# Patient Record
Sex: Male | Born: 1995 | Race: White | Hispanic: No | Marital: Single | Smoking: Never smoker
Health system: Southern US, Community
[De-identification: ages and names within clinical notes are randomized; demographics above are authoritative.]

## PROBLEM LIST (undated history)

## (undated) DIAGNOSIS — K259 Gastric ulcer, unspecified as acute or chronic, without hemorrhage or perforation: Secondary | ICD-10-CM

## (undated) HISTORY — PX: WRIST SURGERY: SHX841

---
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
## 2015-01-19 ENCOUNTER — Emergency Department (INDEPENDENT_AMBULATORY_CARE_PROVIDER_SITE_OTHER)
Admission: EM | Admit: 2015-01-19 | Discharge: 2015-01-19 | Disposition: A | Discharge status: Home / Self Care | Payer: Medicaid Other | Source: Home / Self Care | Attending: Family Medicine | Admitting: Family Medicine

## 2015-01-19 ENCOUNTER — Encounter (HOSPITAL_COMMUNITY): Payer: Self-pay | Admitting: Emergency Medicine

## 2015-01-19 DIAGNOSIS — K2100 Gastro-esophageal reflux disease with esophagitis, without bleeding: Secondary | ICD-10-CM

## 2015-01-19 DIAGNOSIS — K279 Peptic ulcer, site unspecified, unspecified as acute or chronic, without hemorrhage or perforation: Secondary | ICD-10-CM

## 2015-01-19 HISTORY — DX: Gastric ulcer, unspecified as acute or chronic, without hemorrhage or perforation: K25.9

## 2015-01-19 LAB — POCT H PYLORI SCREEN: H. PYLORI SCREEN, POC: NEGATIVE

## 2015-01-19 MED ORDER — GI COCKTAIL ~~LOC~~
30.0000 mL | Freq: Once | ORAL | Status: AC
  Administered 2015-01-19: 30 mL via ORAL

## 2015-01-19 MED ORDER — SUCRALFATE 1 G PO TABS: 1.0000 g | ORAL_TABLET | Freq: Three times a day (TID) | ORAL | Status: DC

## 2015-01-19 MED ORDER — GI COCKTAIL ~~LOC~~
ORAL | Status: AC
  Filled 2015-01-19: qty 30

## 2015-01-19 MED ORDER — ONDANSETRON 4 MG PO TBDP
ORAL_TABLET | ORAL | Status: AC
  Filled 2015-01-19: qty 2

## 2015-01-19 MED ORDER — OMEPRAZOLE 40 MG PO CPDR: 40.0000 mg | DELAYED_RELEASE_CAPSULE | Freq: Two times a day (BID) | ORAL | Status: DC

## 2015-01-19 MED ORDER — ONDANSETRON HCL 4 MG PO TABS: 4.0000 mg | ORAL_TABLET | Freq: Three times a day (TID) | ORAL | Status: DC | PRN

## 2015-01-19 MED ORDER — ONDANSETRON 4 MG PO TBDP
8.0000 mg | ORAL_TABLET | Freq: Once | ORAL | Status: AC
  Administered 2015-01-19: 8 mg via ORAL

## 2015-01-19 NOTE — Discharge Instructions (Signed)
He likely have too much acid in her stomach causing her pain. There is no evidence of infection today. Your given medicine to help reverse the effects of your condition. Please take the Prilosec, Carafate every day as prescribed. Please use the Zofran for additional pain relief. Please call the GI doctor again to try and get an appointment there. Please go to the emergency room if you get worse.

## 2015-01-19 NOTE — ED Notes (Signed)
Pt has almost a one year history for Gastric Ulcers.  Pt was to have an EGD but was unable to have that done.  Pt started back with the abdominal pain but this time it is with vomiting.  Pt is unable to keep anything down for the past 5 hours.

## 2015-01-19 NOTE — ED Provider Notes (Signed)
CSN: [id number]     Arrival date & time 01/19/15  1355 History   First MD Initiated Contact with Patient 01/19/15 1501     Chief Complaint  Patient presents with  . Emesis  . Abdominal Pain   (Consider location/radiation/quality/duration/timing/severity/associated sxs/prior Treatment) HPI Double pain. Epigastric with radiation up his esophagus to lower neck area. Burning. Associated with nausea, ill feeling. Constant. Getting worse. Patient states that he has episodes this from time to time his had a subjective diagnoses of gastric ulcers/PUD. No actual EGD done in the past. Patient was post to follow-up with GI after his last ED visit was unable to get prior authorization needed. Patient states that his symptoms were under control after taking Prilosec and Carafate for a period of time. However he has run out of these medicines. He is not taken it for symptoms today. Denies any fevers, chest pain, short of breath, palpitations This, headache. Intermittent diarrhea. Patient states that he never uses NSAIDs.  Past Medical History  Diagnosis Date  . Multiple gastric ulcers    Past Surgical History  Procedure Laterality Date  . Orthopedic surgery     Family History  Problem Relation Age of Onset  . Heart disease Other    History  Substance Use Topics  . Smoking status: Never Smoker   . Smokeless tobacco: Never Used  . Alcohol Use: No    Review of Systems Per HPI with all other pertinent systems negative.    Allergies  Review of patient's allergies indicates no known allergies.  Home Medications   Prior to Admission medications   Medication Sig Start Date End Date Taking? Authorizing Provider  ibuprofen (ADVIL,MOTRIN) 200 MG tablet Take 200 mg by mouth every 6 (six) hours as needed. For pain    Historical Provider, MD  omeprazole (PRILOSEC) 40 MG capsule Take 1 capsule (40 mg total) by mouth 2 (two) times daily before a meal. 01/19/15   Ozella Rocks, MD  ondansetron (ZOFRAN) 4  MG tablet Take 1 tablet (4 mg total) by mouth every 8 (eight) hours as needed for nausea or vomiting. 01/19/15   Ozella Rocks, MD  sucralfate (CARAFATE) 1 G tablet Take 1 tablet (1 g total) by mouth 4 (four) times daily -  with meals and at bedtime. 01/19/15   Ozella Rocks, MD   Temp(Src) 98.5 F (36.9 C) (Oral)  SpO2 100% Physical Exam Physical Exam  Constitutional: oriented to person, place, and time. appears well-developed and well-nourished. No distress.  HENT:  Head: Normocephalic and atraumatic.  Eyes: EOMI. PERRL.  Neck: Normal range of motion.  Cardiovascular: RRR, no m/r/g, 2+ distal pulses,  Pulmonary/Chest: Effort normal and breath sounds normal. No respiratory distress.  Abdominal: soft, nondistended. Epigastric ttp  Musculoskeletal: Normal range of motion. Non ttp, no effusion.  Neurological: alert and oriented to person, place, and time.  Skin: Skin is warm. No rash noted. non diaphoretic.  Psychiatric: normal mood and affect. behavior is normal. Judgment and thought content normal.   ED Course  Procedures (including critical care time) Labs Review Labs Reviewed - No data to display  Imaging Review No results found.   MDM   1. Reflux esophagitis   2. PUD (peptic ulcer disease)    H. pylori negative. Zofran 8 mg and GI cocktail given here in clinic. Patient to start daily Prilosec and Carafate. Zofran as needed for additional relief. Patient strongly counseled follow-up with GI as previous instructed for EGD.    Onalee Hua  Mauri Reading, MD 01/19/15 [phone]

## 2015-01-22 ENCOUNTER — Encounter (HOSPITAL_COMMUNITY): Payer: Self-pay | Admitting: Emergency Medicine

## 2015-01-22 ENCOUNTER — Emergency Department (HOSPITAL_COMMUNITY)
Admission: EM | Admit: 2015-01-22 | Discharge: 2015-01-22 | Disposition: A | Discharge status: Home / Self Care | Payer: Medicaid Other | Attending: Emergency Medicine | Admitting: Emergency Medicine

## 2015-01-22 DIAGNOSIS — R1115 Cyclical vomiting syndrome unrelated to migraine: Secondary | ICD-10-CM

## 2015-01-22 DIAGNOSIS — K259 Gastric ulcer, unspecified as acute or chronic, without hemorrhage or perforation: Secondary | ICD-10-CM | POA: Insufficient documentation

## 2015-01-22 DIAGNOSIS — R1110 Vomiting, unspecified: Secondary | ICD-10-CM | POA: Diagnosis present

## 2015-01-22 DIAGNOSIS — G43A0 Cyclical vomiting, not intractable: Secondary | ICD-10-CM | POA: Insufficient documentation

## 2015-01-22 DIAGNOSIS — Z79899 Other long term (current) drug therapy: Secondary | ICD-10-CM | POA: Insufficient documentation

## 2015-01-22 LAB — URINALYSIS, ROUTINE W REFLEX MICROSCOPIC
GLUCOSE, UA: NEGATIVE mg/dL
HGB URINE DIPSTICK: NEGATIVE
Ketones, ur: >80 mg/dL — AB
Leukocytes, UA: NEGATIVE
Nitrite: NEGATIVE
PROTEIN: 30 mg/dL — AB
Specific Gravity, Urine: 1.036 — ABNORMAL HIGH (ref 1.005–1.030)
Urobilinogen, UA: 1 mg/dL (ref 0.0–1.0)
pH: 6 (ref 5.0–8.0)

## 2015-01-22 LAB — COMPREHENSIVE METABOLIC PANEL
ALK PHOS: 61 U/L (ref 38–126)
ALT: 17 U/L (ref 17–63)
AST: 26 U/L (ref 15–41)
Albumin: 5.5 g/dL — ABNORMAL HIGH (ref 3.5–5.0)
Anion gap: 15 (ref 5–15)
BUN: 25 mg/dL — ABNORMAL HIGH (ref 6–20)
CALCIUM: 10.5 mg/dL — AB (ref 8.9–10.3)
CO2: 23 mmol/L (ref 22–32)
Chloride: 99 mmol/L — ABNORMAL LOW (ref 101–111)
Creatinine, Ser: 1.18 mg/dL (ref 0.61–1.24)
GLUCOSE: 111 mg/dL — AB (ref 65–99)
Potassium: 3.1 mmol/L — ABNORMAL LOW (ref 3.5–5.1)
Sodium: 137 mmol/L (ref 135–145)
TOTAL PROTEIN: 8.6 g/dL — AB (ref 6.5–8.1)
Total Bilirubin: 1.8 mg/dL — ABNORMAL HIGH (ref 0.3–1.2)

## 2015-01-22 LAB — CBC WITH DIFFERENTIAL/PLATELET
Basophils Absolute: 0 10*3/uL (ref 0.0–0.1)
Basophils Relative: 0 % (ref 0–1)
EOS PCT: 0 % (ref 0–5)
Eosinophils Absolute: 0 10*3/uL (ref 0.0–0.7)
HEMATOCRIT: 45.9 % (ref 39.0–52.0)
HEMOGLOBIN: 16.4 g/dL (ref 13.0–17.0)
LYMPHS ABS: 1.9 10*3/uL (ref 0.7–4.0)
Lymphocytes Relative: 18 % (ref 12–46)
MCH: 29.2 pg (ref 26.0–34.0)
MCHC: 35.7 g/dL (ref 30.0–36.0)
MCV: 81.7 fL (ref 78.0–100.0)
MONOS PCT: 9 % (ref 3–12)
Monocytes Absolute: 1 10*3/uL (ref 0.1–1.0)
Neutro Abs: 7.7 10*3/uL (ref 1.7–7.7)
Neutrophils Relative %: 73 % (ref 43–77)
Platelets: 227 10*3/uL (ref 150–400)
RBC: 5.62 MIL/uL (ref 4.22–5.81)
RDW: 13 % (ref 11.5–15.5)
WBC: 10.6 10*3/uL — ABNORMAL HIGH (ref 4.0–10.5)

## 2015-01-22 LAB — I-STAT CG4 LACTIC ACID, ED
LACTIC ACID, VENOUS: 2.15 mmol/L — AB (ref 0.5–2.0)
Lactic Acid, Venous: 0.95 mmol/L (ref 0.5–2.0)

## 2015-01-22 LAB — URINE MICROSCOPIC-ADD ON

## 2015-01-22 LAB — LIPASE, BLOOD: Lipase: 20 U/L — ABNORMAL LOW (ref 22–51)

## 2015-01-22 MED ORDER — PROMETHAZINE HCL 25 MG PO TABS: 25.0000 mg | ORAL_TABLET | Freq: Four times a day (QID) | ORAL | Status: DC | PRN

## 2015-01-22 MED ORDER — POTASSIUM CHLORIDE CRYS ER 20 MEQ PO TBCR
40.0000 meq | EXTENDED_RELEASE_TABLET | Freq: Once | ORAL | Status: AC
  Administered 2015-01-22: 40 meq via ORAL
  Filled 2015-01-22: qty 2

## 2015-01-22 MED ORDER — PROMETHAZINE HCL 25 MG/ML IJ SOLN
12.5000 mg | Freq: Once | INTRAMUSCULAR | Status: AC
  Administered 2015-01-22: 12.5 mg via INTRAVENOUS
  Filled 2015-01-22: qty 1

## 2015-01-22 MED ORDER — SODIUM CHLORIDE 0.9 % IV BOLUS (SEPSIS)
1000.0000 mL | Freq: Once | INTRAVENOUS | Status: AC
  Administered 2015-01-22: 1000 mL via INTRAVENOUS

## 2015-01-22 MED ORDER — SODIUM CHLORIDE 0.9 % IV BOLUS (SEPSIS)
1000.0000 mL | Freq: Once | INTRAVENOUS | Status: AC
Start: 2015-01-22 — End: 2015-01-22
  Administered 2015-01-22: 1000 mL via INTRAVENOUS

## 2015-01-22 NOTE — ED Notes (Signed)
MD at bedside. 

## 2015-01-22 NOTE — ED Notes (Signed)
Pt reports emesis x 3 days which is not controlled by zofran which was prescribed 3 days ago. Pt alert x4. NAD at this time.

## 2015-01-22 NOTE — ED Provider Notes (Signed)
CSN: [id number]     Arrival date & time 01/22/15  0910 History   First MD Initiated Contact with Patient 01/22/15 0932     Chief Complaint  Patient presents with  . Emesis     (Consider location/radiation/quality/duration/timing/severity/associated sxs/prior Treatment) HPI Comments: 19 year old otherwise healthy male who presents with epigastric pain and vomiting. The patient was evaluated at urgent care 3 days ago for these symptoms. His workup was unremarkable at that time and he was discharged on Zofran, omeprazole, and Carafate for suspicion of PUD. He had a similar episode of epigastric pain several years ago for which he was supposed to follow up with a gastroenterologist for upper GI but was unable to follow-up due to need for referral from PCP. He states that since being home, he has continued to have multiple episodes of nonbloody emesis and continued intermittent epigastric pain that does not know his into his lower abdomen. He has been constipated and denies any episodes of diarrhea or bloody stools. No fevers but he has been sweaty and chilled at night. He was up all night vomiting and states that the Zofran has not helped. He has been unable to tolerate the other by mouth medications. Patient does endorse marijuana use more than once weekly. He states that the marijuana seems to help with the nausea. However, he has not used in the past few days since he's been vomiting so much.  Patient is a 19 y.o. male presenting with vomiting. The history is provided by the patient.  Emesis   Past Medical History  Diagnosis Date  . Multiple gastric ulcers    Past Surgical History  Procedure Laterality Date  . Orthopedic surgery     Family History  Problem Relation Age of Onset  . Heart disease Other    Social History  Substance Use Topics  . Smoking status: Never Smoker   . Smokeless tobacco: Never Used  . Alcohol Use: No    Review of Systems  Gastrointestinal: Positive for  vomiting.    10 Systems reviewed and are negative for acute change except as noted in the HPI.   Allergies  Review of patient's allergies indicates no known allergies.  Home Medications   Prior to Admission medications   Medication Sig Start Date End Date Taking? Authorizing Provider  cetirizine (ZYRTEC) 10 MG tablet Take 10 mg by mouth once.   Yes Historical Provider, MD  omeprazole (PRILOSEC) 40 MG capsule Take 1 capsule (40 mg total) by mouth 2 (two) times daily before a meal. 01/19/15  Yes Ozella Rocks, MD  ondansetron (ZOFRAN) 4 MG tablet Take 1 tablet (4 mg total) by mouth every 8 (eight) hours as needed for nausea or vomiting. 01/19/15  Yes Ozella Rocks, MD  sucralfate (CARAFATE) 1 G tablet Take 1 tablet (1 g total) by mouth 4 (four) times daily -  with meals and at bedtime. 01/19/15  Yes Ozella Rocks, MD  promethazine (PHENERGAN) 25 MG tablet Take 1 tablet (25 mg total) by mouth every 6 (six) hours as needed for nausea or vomiting. 01/22/15   Laurence Spates, MD   BP 123/63 mmHg  Pulse 69  Temp(Src) 99.2 F (37.3 C) (Oral)  Resp 17  SpO2 99% Physical Exam  Constitutional: He is oriented to person, place, and time. He appears well-developed and well-nourished. No distress.  HENT:  Head: Normocephalic and atraumatic.  dry mucous membranes  Eyes: Conjunctivae are normal. Pupils are equal, round, and reactive to light.  Neck: Neck supple.  Cardiovascular: Normal rate, regular rhythm and normal heart sounds.   No murmur heard. Pulmonary/Chest: Effort normal and breath sounds normal.  Abdominal: Soft. Bowel sounds are normal. He exhibits no distension.  Mild mid epigastric tenderness w/ no rebound or guarding  Musculoskeletal: He exhibits no edema.  Neurological: He is alert and oriented to person, place, and time.  Fluent speech  Skin: Skin is warm and dry.  Psychiatric: He has a normal mood and affect. Judgment normal.  Nursing note and vitals reviewed.   ED  Course  Procedures (including critical care time) Labs Review Labs Reviewed  COMPREHENSIVE METABOLIC PANEL - Abnormal; Notable for the following:    Potassium 3.1 (*)    Chloride 99 (*)    Glucose, Bld 111 (*)    BUN 25 (*)    Calcium 10.5 (*)    Total Protein 8.6 (*)    Albumin 5.5 (*)    Total Bilirubin 1.8 (*)    All other components within normal limits  LIPASE, BLOOD - Abnormal; Notable for the following:    Lipase 20 (*)    All other components within normal limits  CBC WITH DIFFERENTIAL/PLATELET - Abnormal; Notable for the following:    WBC 10.6 (*)    All other components within normal limits  URINALYSIS, ROUTINE W REFLEX MICROSCOPIC (NOT AT [facility]) - Abnormal; Notable for the following:    Color, Urine AMBER (*)    Specific Gravity, Urine 1.036 (*)    Bilirubin Urine SMALL (*)    Ketones, ur >80 (*)    Protein, ur 30 (*)    All other components within normal limits  I-STAT CG4 LACTIC ACID, ED - Abnormal; Notable for the following:    Lactic Acid, Venous 2.15 (*)    All other components within normal limits  URINE MICROSCOPIC-ADD ON  I-STAT CG4 LACTIC ACID, ED    Imaging Review No results found.   EKG Interpretation None      MDM   Final diagnoses:  Non-intractable cyclical vomiting with nausea   19 year old male who presents with ongoing vomiting and midepigastric pain which has not improved after receiving Zofran, omeprazole, and Carafate from urgent care a few days ago. Patient well appearing with normal vital signs at presentation. He had mild midepigastric tenderness but no lower abdominal tenderness. He does endorse marijuana use and I suspect he may have a component of cyclical vomiting. Obtain labs listed above. Lactate 2.15 initially. Gave the patient on IV fluid bolus as well as Phenergan.  Labs notable for UA containing ketones and protein suggestive of dehydration, which is consistent with the patient's elevated lactate. Repeat lactate after 2 L IV  fluids is normal.  On repeat examination, the patient is well-appearing and states that his headache had improved. He has had no episodes of vomiting in the emergency department and has been able to drink water and eat crackers. 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 He has no lower abdominal pain to suggest acute abdominal process. The rest of his CMP is reassuring and he has been adequately rehydrated here. I provided him with Phenergan to use as needed at home and instructed him to discontinue all marijuana use pending follow-up with primary care provider for GI referral. Precautions reviewed and patient voiced understanding. Patient discharged in satisfactory condition.  Ambrose Finland  Aly Seidenberg, MD 01/22/15 1337

## 2015-01-22 NOTE — Discharge Instructions (Signed)
°Emergency Department Resource Guide °1) Find a Doctor and Pay Out of Pocket °Although you won't have to find out who is covered by your insurance plan, it is a good idea to ask around and get recommendations. You will then need to call the office and see if the doctor you have chosen will accept you as a new patient and what types of options they offer for patients who are self-pay. Some doctors offer discounts or will set up payment plans for their patients who do not have insurance, but you will need to ask so you aren't surprised when you get to your appointment. ° °2) Contact Your Local Health Department °Not all health departments have doctors that can see patients for sick visits, but many do, so it is worth a call to see if yours does. If you don't know where your local health department is, you can check in your phone book. The CDC also has a tool to help you locate your state's health department, and many state websites also have listings of all of their local health departments. ° °3) Find a Walk-in Clinic °If your illness is not likely to be very severe or complicated, you may want to try a walk in clinic. These are popping up all over the country in pharmacies, drugstores, and shopping centers. They're usually staffed by nurse practitioners or physician assistants that have been trained to treat common illnesses and complaints. They're usually fairly quick and inexpensive. However, if you have serious medical issues or chronic medical problems, these are probably not your best option. ° °No Primary Care Doctor: °- Call [facility] at  832-8000 - they can help you locate a primary care doctor that  accepts your insurance, provides certain services, etc. °- Physician Referral Service- 1-800-533-3463 ° °Chronic Pain Problems: °Organization         Address  Phone   Notes  °[facility]  (336) 297-2271 Patients need to be referred by their primary care doctor.  ° °Medication  Assistance: °Organization         Address  Phone   Notes  °[place] [facility] [address] °[place], [place] [address] (336) 641-8030 --Must be a resident of [place] °-- Must have NO insurance coverage whatsoever (no Medicaid/ Medicare, etc.) °-- The pt. MUST have a primary care doctor that directs their care regularly and follows them in the community °  °[facility]  (866) 331-1348   °[employer]  (888) 892-1162   ° °Agencies that provide inexpensive medical care: °Organization         Address  Phone   Notes  °[facility]  (336) 832-8035   °[facility]    (336) 832-7272   °[facility] [address] °[place], [place] [address] (336) 832-4777   °[facility] [address], °[place] (336) 271-4999   °[facility]    (336) 373-0678   °[facility]    (336) 272-1050   °[facility] ° [address], [place] Phone:  (336) 832-4444, Fax:  (336) 832-4440 Hours of Operation:  9 am - 6 pm, M-F.  Also accepts Medicaid/Medicare and self-pay.  °[facility] ° [address],  Phone: (336) 832-3150, Fax: (336) 832-3151. Hours of Operation:  8:30 am - 5:30 pm, M-F.  Also accepts Medicaid and self-pay.  °[facility] [place] [address], [place] Phone: (336) 878-6027   °[facility] [address], [place], [place] (336)723-1848, Ext. 123 Mondays & Thursdays: 7-9 AM.  First 15 patients are seen on a first come, first serve basis. °  ° °Medicaid-accepting [place] Providers: ° °Organization         Address  Phone   Notes  °[facility] [address], [place] (336) 641-2100 Also accepts self-pay patients.  °[facility] [address], [place] ° (336) 856-9996   °[facility] [address],  [place]  (336) 288-8857   °[facility] [address] [place] [address], [place] (336) 299-7000   °Veita Bland [address], [place]  ° (336) 373-1557 Only accepts [employer] Access Medicaid patients after they have their name applied to their card.  ° °Self-Pay (no insurance) in [place]: ° °Organization         Address  Phone   Notes  °Sickle Cell Patients, [facility] [address], [place] (336) 832-1970   °[facility] [address], [place] (336) 832-4400   °[facility] ° [address], [place] (336) 992-4800   °[facility]/Dr. Osei-Bonsu ° [address] [place] [address], [place] or [address], [place] (336) 841-8500 Phone number for both [place] and [place] locations is the same.  °[facility] [address], [place] (336) 299-0000   °[facility] [address] [place] [address], [place] or [address] (336) 852-7530 °(336) 878-2260   °[facility] [address], [place] (336) 350-1642, phone; (336) 294-5005, fax Sees patients 1st and 3rd Saturday of every month.  Must not qualify for public or private insurance (i.e. Medicaid, Medicare, [place] Health Choice, Veterans' Benefits) • Household income should be no more than 200% of the poverty level •The clinic cannot treat you if you are pregnant or think you are pregnant • Sexually transmitted diseases are not treated at the clinic.  ° ° °Dental Care: °Organization         Address  Phone  Notes  °[place] [facility] [address],  (336) 641-6152 Accepts children up to age 21 who are enrolled in Medicaid or [place] Health Choice; pregnant women with a Medicaid card; and children who have applied for Medicaid or [place] Health Choice, but were declined, whose parents can pay a reduced fee at time of service.  °[place]  [facility] [place]  [address], [place] (336) 641-7733 Accepts children up to age 21 who are enrolled in Medicaid or [place] Health Choice; pregnant women with a Medicaid card; and children who have applied for Medicaid or [place] Health Choice, but were declined, whose parents can pay a reduced fee at time of service.  °[facility] ° [address],  (336) 641-4533 Patients are seen by appointment only. Walk-ins are not accepted. [facility] will see patients 18 years of age and older. °Monday - Tuesday (8am-5pm) °Most Wednesdays (8:30-5pm) °$30 per visit, cash only  °[facility] ° [address], [place] (336) 641-4533 Patients are seen by appointment only. Walk-ins are not accepted. [facility] will see patients 18 years of age and older. °One   Wednesday Evening (Monthly: Volunteer Based).  $30 per visit, cash only  °[facility]  (919) 537-3737 for adults; Children under age 4, call Graduate Pediatric Dentistry at (919) [phone]. Children aged 4-14, please call (919) [phone] to request a pediatric application. ° Dental services are provided in all areas of dental care including fillings, crowns and bridges, complete and partial dentures, implants, gum treatment, root canals, and extractions. Preventive care is also provided. Treatment is provided to both adults and children. °Patients are selected via a lottery and there is often a waiting list. °  °[facility] [address], °[place] ° (336) 763-8833 [email or web address] °  °[facility] [address], [place], [place] (336)723-1848, Ext. 123 Second and Fourth Thursday of each month, opens at 6:30 AM; Clinic ends at 9 AM.  Patients are seen on a first-come first-served basis, and a limited number are seen during each clinic.  ° °[facility] ° [address], [place], [place] (336) 723-7904    Eligibility Requirements °You must have lived in [place], or [place] counties for at least the last three months. °  You cannot be eligible for state or federal sponsored healthcare insurance, including Veterans Administration, Medicaid, or Medicare. °  You generally cannot be eligible for healthcare insurance through your employer.  °  How to apply: °Eligibility screenings are held every Tuesday and Wednesday afternoon from 1:00 pm until 4:00 pm. You do not need an appointment for the interview!  °[facility] [address], [place], [place] 336-631-2330   °[facility]  336-342-8273   °[facility]  336-703-3100   °[facility]  336-570-6415   ° °Behavioral Health Resources in the Community: °Intensive Outpatient Programs °Organization         Address  Phone  Notes  °[facility] [address], [place], [place] 336-878-6098   °[facility] [address], [place], [place] 336-832-9800   °[facility] [address], [place], [place] ° 336-882-2125   °[facility] [address],  °[place], [place] 1-800-853-5163 or 336-641-4981   °Substance Abuse Resources °Organization         Address  Phone  Notes  °[facility]  336-882-2125   °[facility]  336-784-9470   °[facility]  336-285-9073   °[facility]  336-845-3988   °Residential & Outpatient Substance Abuse Program  1-800-659-3381   °Psychological Services °Organization         Address  Phone  Notes  °[facility]  336- 832-9600   °[facility]  336- 378-7881   °[facility] [address], [place] 1-800-853-5163 or 336-641-4981   ° °Mobile Crisis Teams °Organization         Address  Phone  Notes  °[facility], Mobile Crisis Care Unit  1-877-626-1772   °Assertive °[facility] ° [address]  [place], [place] 336-834-9664   °Sharon DeEsch [address] °[place] 336-554-5454   ° °Self-Help/Support Groups °Organization         Address  Phone             Notes  °[facility] [place] - variety of support groups  336- 373-1402 Call for more information  °Narcotics Anonymous (NA), [facility] [address], °[place]  2 meetings at this location  ° °  Residential Treatment Programs °Organization         Address  Phone  Notes  °[facility] [address],    °[place]  1-866-801-8205   °[facility] ° [address], [place], [place] 704-293-8524   °[facility] [address], [place] 336-845-3988 Admissions: 8am-3pm M-F  °[facility] [address]    °[place], [place] 336-841-1104   °[facility] [address], [place], [place] 336-379-7146   °[facility] [address]  °[place], [place] 336-285-9073   °[facility] - Intensive Outpatient [address], [place], [place] 336-852-3033   °[facility] [address]  °[place], [place] 1-877-615-2722 or 336-784-9470   °[facility] [address] [place], [place] 336-227-7417 Accepts Medicaid  °[facility] [address]  °[place] 1-800-659-3381 Substance Abuse/Addiction Treatment  ° °[place] Behavioral Health Resources °Organization         Address  Phone  Notes  °[facility]  (888) 581-9988   °Julie Brannon, PhD [address] [place], [place]   (336) 349-5553 or (336) 951-0000   °[facility]   [address] °[place], [place] (336) 349-4454   °[facility] [address], [place], [place] (336) 342-8316 Insurance/Medicaid/sponsorship through [facility]  °[facility] [address]                                    [place], [place] (336) 342-8316 Therapy/tele-psych/case    °[facility] [address]  ° [place],  (336) 349-2233    °Dr. Arfeen  (336) 349-4544   °[facility] [place]  [employer] [place] [facility] 1) [address], [place] °2) [address], [place] °3)  [address], [place] (336) 349-3220 °(336) 342-7768 ° °(336) 342-8140   °[place] [facility] (336) 342-1394 or (336) 342-3537 (After Hours)    ° ° °

## 2015-03-01 ENCOUNTER — Encounter (HOSPITAL_COMMUNITY): Payer: Self-pay | Admitting: *Deleted

## 2015-03-01 DIAGNOSIS — Z79899 Other long term (current) drug therapy: Secondary | ICD-10-CM | POA: Insufficient documentation

## 2015-03-01 DIAGNOSIS — E860 Dehydration: Secondary | ICD-10-CM | POA: Diagnosis not present

## 2015-03-01 DIAGNOSIS — Z8719 Personal history of other diseases of the digestive system: Secondary | ICD-10-CM | POA: Diagnosis not present

## 2015-03-01 DIAGNOSIS — R112 Nausea with vomiting, unspecified: Secondary | ICD-10-CM | POA: Insufficient documentation

## 2015-03-01 DIAGNOSIS — R109 Unspecified abdominal pain: Secondary | ICD-10-CM | POA: Diagnosis present

## 2015-03-01 LAB — COMPREHENSIVE METABOLIC PANEL
ALT: 18 U/L (ref 17–63)
AST: 25 U/L (ref 15–41)
Albumin: 5.5 g/dL — ABNORMAL HIGH (ref 3.5–5.0)
Alkaline Phosphatase: 69 U/L (ref 38–126)
Anion gap: 11 (ref 5–15)
BUN: 11 mg/dL (ref 6–20)
CHLORIDE: 99 mmol/L — AB (ref 101–111)
CO2: 27 mmol/L (ref 22–32)
CREATININE: 1.11 mg/dL (ref 0.61–1.24)
Calcium: 10.1 mg/dL (ref 8.9–10.3)
GFR calc Af Amer: >60 mL/min (ref >60)
GLUCOSE: 110 mg/dL — AB (ref 65–99)
Potassium: 4.7 mmol/L (ref 3.5–5.1)
Sodium: 137 mmol/L (ref 135–145)
Total Bilirubin: 0.9 mg/dL (ref 0.3–1.2)
Total Protein: 8.6 g/dL — ABNORMAL HIGH (ref 6.5–8.1)

## 2015-03-01 LAB — CBC
HCT: 48.9 % (ref 39.0–52.0)
Hemoglobin: 16.8 g/dL (ref 13.0–17.0)
MCH: 29.5 pg (ref 26.0–34.0)
MCHC: 34.4 g/dL (ref 30.0–36.0)
MCV: 85.9 fL (ref 78.0–100.0)
Platelets: 235 10*3/uL (ref 150–400)
RBC: 5.69 MIL/uL (ref 4.22–5.81)
RDW: 13.3 % (ref 11.5–15.5)
WBC: 17.4 10*3/uL — AB (ref 4.0–10.5)

## 2015-03-01 LAB — LIPASE, BLOOD: LIPASE: 25 U/L (ref 22–51)

## 2015-03-01 NOTE — ED Notes (Signed)
Pt states that he takes prilosec but it sometimes make him feel sicker.

## 2015-03-01 NOTE — ED Notes (Signed)
Pt states that he was prescribed promethezine which helps really well but he ran out of them. States he was supposed to see a specialist but the referral did not work out because his PCP moved.

## 2015-03-01 NOTE — ED Notes (Signed)
Pt states that he has been in the hospital recently for stomach ulcers. Pt states that he is having abd pain and emesis.

## 2015-03-02 ENCOUNTER — Emergency Department (HOSPITAL_COMMUNITY)
Admission: EM | Admit: 2015-03-02 | Discharge: 2015-03-02 | Disposition: A | Discharge status: Home / Self Care | Payer: Medicaid Other | Attending: Emergency Medicine | Admitting: Emergency Medicine

## 2015-03-02 DIAGNOSIS — E860 Dehydration: Secondary | ICD-10-CM

## 2015-03-02 DIAGNOSIS — R112 Nausea with vomiting, unspecified: Secondary | ICD-10-CM

## 2015-03-02 MED ORDER — OMEPRAZOLE 40 MG PO CPDR: 40.0000 mg | DELAYED_RELEASE_CAPSULE | Freq: Two times a day (BID) | ORAL | Status: DC

## 2015-03-02 MED ORDER — PROMETHAZINE HCL 25 MG PO TABS: 25.0000 mg | ORAL_TABLET | Freq: Four times a day (QID) | ORAL | Status: DC | PRN

## 2015-03-02 MED ORDER — METOCLOPRAMIDE HCL 5 MG/ML IJ SOLN
10.0000 mg | Freq: Once | INTRAMUSCULAR | Status: AC
  Administered 2015-03-02: 10 mg via INTRAVENOUS
  Filled 2015-03-02: qty 2

## 2015-03-02 MED ORDER — METOCLOPRAMIDE HCL 10 MG PO TABS: 10.0000 mg | ORAL_TABLET | Freq: Four times a day (QID) | ORAL | Status: DC | PRN

## 2015-03-02 MED ORDER — SODIUM CHLORIDE 0.9 % IV BOLUS (SEPSIS)
2000.0000 mL | Freq: Once | INTRAVENOUS | Status: AC
  Administered 2015-03-02: 2000 mL via INTRAVENOUS

## 2015-03-02 MED ORDER — LORAZEPAM 2 MG/ML IJ SOLN
1.0000 mg | Freq: Once | INTRAMUSCULAR | Status: AC
  Administered 2015-03-02: 1 mg via INTRAVENOUS
  Filled 2015-03-02: qty 1

## 2015-03-02 NOTE — ED Notes (Signed)
Patient left at this time with all belongings. 

## 2015-03-02 NOTE — ED Provider Notes (Signed)
CSN: [id number]     Arrival date & time 03/01/15  2242 History  This chart was scribed for Azalia Bilis, MD by Evon Slack, ED Scribe. This patient was seen in room D33C/D33C and the patient's care was started at 2:04 AM.     Chief Complaint  Patient presents with  . Abdominal Pain   The history is provided by the patient. No language interpreter was used.   HPI Comments: Steven Burch is a 19 y.o. male with PMHx of gastric ulcers who presents to the Emergency Department complaining of recurrent abdominal pain onset yesterday evening. Pt reports associated chills, nausea and vomiting. Pt reports 1 black stool yesterday. Pt does report marijuana use about 1x every other day. Prescribed Phenergan that provides relief but states that he recently ran out. Pt reports recently been in the ED for similar symptoms 1 month prior. Pt states that he has been vomiting intermittently the past month but states that it usually resolves after 1 day. He states that he was suppose to see and Gi specialist but was unable due to his PCP retiring. Pt denies fever or other related symptoms.   Past Medical History  Diagnosis Date  . Multiple gastric ulcers    Past Surgical History  Procedure Laterality Date  . Orthopedic surgery     Family History  Problem Relation Age of Onset  . Heart disease Other    Social History  Substance Use Topics  . Smoking status: Never Smoker   . Smokeless tobacco: Never Used  . Alcohol Use: No    Review of Systems  Constitutional: Positive for chills. Negative for fever.  Gastrointestinal: Positive for nausea, vomiting and abdominal pain.  All other systems reviewed and are negative.    Allergies  Review of patient's allergies indicates no known allergies.  Home Medications   Prior to Admission medications   Medication Sig Start Date End Date Taking? Authorizing Provider  cetirizine (ZYRTEC) 10 MG tablet Take 10 mg by mouth once.    Historical Provider, MD   omeprazole (PRILOSEC) 40 MG capsule Take 1 capsule (40 mg total) by mouth 2 (two) times daily before a meal. 01/19/15   Ozella Rocks, MD  ondansetron (ZOFRAN) 4 MG tablet Take 1 tablet (4 mg total) by mouth every 8 (eight) hours as needed for nausea or vomiting. 01/19/15   Ozella Rocks, MD  promethazine (PHENERGAN) 25 MG tablet Take 1 tablet (25 mg total) by mouth every 6 (six) hours as needed for nausea or vomiting. 01/22/15   Laurence Spates, MD  sucralfate (CARAFATE) 1 G tablet Take 1 tablet (1 g total) by mouth 4 (four) times daily -  with meals and at bedtime. 01/19/15   Ozella Rocks, MD   BP 114/46 mmHg  Pulse 72  Temp(Src) 98 F (36.7 C) (Oral)  Resp 14  Ht  (1.727 m)  Wt 160 lb (72.576 kg)  BMI 24.33 kg/m2  SpO2 99%   Physical Exam  Constitutional: He is oriented to person, place, and time. He appears well-developed and well-nourished.  HENT:  Head: Normocephalic and atraumatic.  Eyes: EOM are normal.  Neck: Normal range of motion.  Cardiovascular: Normal rate, regular rhythm, normal heart sounds and intact distal pulses.   Pulmonary/Chest: Effort normal and breath sounds normal. No respiratory distress.  Abdominal: Soft. He exhibits no distension. There is no tenderness.  Musculoskeletal: Normal range of motion.  Neurological: He is alert and oriented to person, place, and  time.  Skin: Skin is warm and dry.  Psychiatric: He has a normal mood and affect. Judgment normal.  Nursing note and vitals reviewed.   ED Course  Procedures (including critical care time) DIAGNOSTIC STUDIES: Oxygen Saturation is 99% on RA, normal by my interpretation.    COORDINATION OF CARE: 2:13 AM-Discussed treatment plan with pt at bedside and pt agreed to plan.     Labs Review Labs Reviewed  COMPREHENSIVE METABOLIC PANEL - Abnormal; Notable for the following:    Chloride 99 (*)    Glucose, Bld 110 (*)    Total Protein 8.6 (*)    Albumin 5.5 (*)    All other components  within normal limits  CBC - Abnormal; Notable for the following:    WBC 17.4 (*)    All other components within normal limits  LIPASE, BLOOD  URINALYSIS, ROUTINE W REFLEX MICROSCOPIC (NOT AT [facility])    Imaging Review No results found.    EKG Interpretation None      MDM   Final diagnoses:  None      4:41 AM  Feels much better after IV fluids and anti-emetics.  Patient will need GI follow-up.  He understands to return to the emergency department for new or worsening symptoms.  I recommended that he stop smoking marijuana.  He may have some degree of gastroparesis.  I'll put the patient on when necessary Phenergan and when necessary Reglan for his nausea and vomiting.  I personally performed the services described in this documentation, which was scribed in my presence. The recorded information has been reviewed and is accurate.     Azalia Bilis, MD 03/02/15 [phone]

## 2015-03-02 NOTE — Discharge Instructions (Signed)
Dehydration, Adult Dehydration is when you lose more fluids from the body than you take in. Vital organs like the kidneys, brain, and heart cannot function without a proper amount of fluids and salt. Any loss of fluids from the body can cause dehydration.  CAUSES   Vomiting.  Diarrhea.  Excessive sweating.  Excessive urine output.  Fever. SYMPTOMS  Mild dehydration  Thirst.  Dry lips.  Slightly dry mouth. Moderate dehydration  Very dry mouth.  Sunken eyes.  Skin does not bounce back quickly when lightly pinched and released.  Dark urine and decreased urine production.  Decreased tear production.  Headache. Severe dehydration  Very dry mouth.  Extreme thirst.  Rapid, weak pulse (more than 100 beats per minute at rest).  Cold hands and feet.  Not able to sweat in spite of heat and temperature.  Rapid breathing.  Blue lips.  Confusion and lethargy.  Difficulty being awakened.  Minimal urine production.  No tears. DIAGNOSIS  Your caregiver will diagnose dehydration based on your symptoms and your exam. Blood and urine tests will help confirm the diagnosis. The diagnostic evaluation should also identify the cause of dehydration. TREATMENT  Treatment of mild or moderate dehydration can often be done at home by increasing the amount of fluids that you drink. It is best to drink small amounts of fluid more often. Drinking too much at one time can make vomiting worse. Refer to the home care instructions below. Severe dehydration needs to be treated at the hospital where you will probably be given intravenous (IV) fluids that contain water and electrolytes. HOME CARE INSTRUCTIONS   Ask your caregiver about specific rehydration instructions.  Drink enough fluids to keep your urine clear or pale yellow.  Drink small amounts frequently if you have nausea and vomiting.  Eat as you normally do.  Avoid:  Foods or drinks high in sugar.  Carbonated  drinks.  Juice.  Extremely hot or cold fluids.  Drinks with caffeine.  Fatty, greasy foods.  Alcohol.  Tobacco.  Overeating.  Gelatin desserts.  Wash your hands well to avoid spreading bacteria and viruses.  Only take over-the-counter or prescription medicines for pain, discomfort, or fever as directed by your caregiver.  Ask your caregiver if you should continue all prescribed and over-the-counter medicines.  Keep all follow-up appointments with your caregiver. SEEK MEDICAL CARE IF:  You have abdominal pain and it increases or stays in one area (localizes).  You have a rash, stiff neck, or severe headache.  You are irritable, sleepy, or difficult to awaken.  You are weak, dizzy, or extremely thirsty. SEEK IMMEDIATE MEDICAL CARE IF:   You are unable to keep fluids down or you get worse despite treatment.  You have frequent episodes of vomiting or diarrhea.  You have blood or green matter (bile) in your vomit.  You have blood in your stool or your stool looks black and tarry.  You have not urinated in 6 to 8 hours, or you have only urinated a small amount of very dark urine.  You have a fever.  You faint. MAKE SURE YOU:   Understand these instructions.  Will watch your condition.  Will get help right away if you are not doing well or get worse. Document Released: 05/30/2005 Document Revised: 08/22/2011 Document Reviewed: 01/17/2011 Exit[employer] Patient Information 2015 [employer]. This information is not intended to replace advice given to you by your health care provider. Make sure you discuss any questions you have with your health care   provider.  

## 2015-06-13 ENCOUNTER — Emergency Department (HOSPITAL_COMMUNITY)
Admission: EM | Admit: 2015-06-13 | Discharge: 2015-06-13 | Disposition: A | Discharge status: Home / Self Care | Payer: Medicaid Other | Attending: Emergency Medicine | Admitting: Emergency Medicine

## 2015-06-13 ENCOUNTER — Encounter (HOSPITAL_COMMUNITY): Payer: Self-pay | Admitting: Emergency Medicine

## 2015-06-13 DIAGNOSIS — R1115 Cyclical vomiting syndrome unrelated to migraine: Secondary | ICD-10-CM

## 2015-06-13 DIAGNOSIS — R1084 Generalized abdominal pain: Secondary | ICD-10-CM

## 2015-06-13 DIAGNOSIS — Z8719 Personal history of other diseases of the digestive system: Secondary | ICD-10-CM | POA: Diagnosis not present

## 2015-06-13 DIAGNOSIS — G43A0 Cyclical vomiting, not intractable: Secondary | ICD-10-CM | POA: Insufficient documentation

## 2015-06-13 DIAGNOSIS — R109 Unspecified abdominal pain: Secondary | ICD-10-CM | POA: Diagnosis present

## 2015-06-13 DIAGNOSIS — F1210 Cannabis abuse, uncomplicated: Secondary | ICD-10-CM | POA: Diagnosis not present

## 2015-06-13 DIAGNOSIS — F1310 Sedative, hypnotic or anxiolytic abuse, uncomplicated: Secondary | ICD-10-CM | POA: Insufficient documentation

## 2015-06-13 LAB — URINALYSIS, ROUTINE W REFLEX MICROSCOPIC
Glucose, UA: NEGATIVE mg/dL
HGB URINE DIPSTICK: NEGATIVE
Ketones, ur: >80 mg/dL — AB
Nitrite: NEGATIVE
Protein, ur: 100 mg/dL — AB
SPECIFIC GRAVITY, URINE: 1.037 — AB (ref 1.005–1.030)
pH: 8.5 — ABNORMAL HIGH (ref 5.0–8.0)

## 2015-06-13 LAB — COMPREHENSIVE METABOLIC PANEL
ALT: 39 U/L (ref 17–63)
ANION GAP: 17 — AB (ref 5–15)
AST: 44 U/L — ABNORMAL HIGH (ref 15–41)
Albumin: 5.1 g/dL — ABNORMAL HIGH (ref 3.5–5.0)
Alkaline Phosphatase: 70 U/L (ref 38–126)
BUN: 17 mg/dL (ref 6–20)
CHLORIDE: 102 mmol/L (ref 101–111)
CO2: 24 mmol/L (ref 22–32)
Calcium: 10.9 mg/dL — ABNORMAL HIGH (ref 8.9–10.3)
Creatinine, Ser: 1.13 mg/dL (ref 0.61–1.24)
Glucose, Bld: 149 mg/dL — ABNORMAL HIGH (ref 65–99)
POTASSIUM: 3.7 mmol/L (ref 3.5–5.1)
Sodium: 143 mmol/L (ref 135–145)
Total Bilirubin: 1.3 mg/dL — ABNORMAL HIGH (ref 0.3–1.2)
Total Protein: 8.5 g/dL — ABNORMAL HIGH (ref 6.5–8.1)

## 2015-06-13 LAB — CBC WITH DIFFERENTIAL/PLATELET
Basophils Absolute: 0 10*3/uL (ref 0.0–0.1)
Basophils Relative: 0 %
EOS ABS: 0 10*3/uL (ref 0.0–0.7)
Eosinophils Relative: 0 %
HEMATOCRIT: 44.3 % (ref 39.0–52.0)
Hemoglobin: 14.9 g/dL (ref 13.0–17.0)
LYMPHS ABS: 1.1 10*3/uL (ref 0.7–4.0)
LYMPHS PCT: 7 %
MCH: 28.1 pg (ref 26.0–34.0)
MCHC: 33.6 g/dL (ref 30.0–36.0)
MCV: 83.6 fL (ref 78.0–100.0)
Monocytes Absolute: 0.9 10*3/uL (ref 0.1–1.0)
Monocytes Relative: 5 %
NEUTROS ABS: 13.9 10*3/uL — AB (ref 1.7–7.7)
Neutrophils Relative %: 88 %
Platelets: 261 10*3/uL (ref 150–400)
RBC: 5.3 MIL/uL (ref 4.22–5.81)
RDW: 13.2 % (ref 11.5–15.5)
WBC: 15.8 10*3/uL — AB (ref 4.0–10.5)

## 2015-06-13 LAB — RAPID URINE DRUG SCREEN, HOSP PERFORMED
Amphetamines: NOT DETECTED
BENZODIAZEPINES: POSITIVE — AB
Barbiturates: NOT DETECTED
Cocaine: NOT DETECTED
Opiates: NOT DETECTED
Tetrahydrocannabinol: POSITIVE — AB

## 2015-06-13 LAB — URINE MICROSCOPIC-ADD ON
BACTERIA UA: NONE SEEN
SQUAMOUS EPITHELIAL / LPF: NONE SEEN

## 2015-06-13 LAB — LIPASE, BLOOD: LIPASE: 20 U/L (ref 11–51)

## 2015-06-13 MED ORDER — GI COCKTAIL ~~LOC~~
30.0000 mL | Freq: Once | ORAL | Status: DC
  Filled 2015-06-13: qty 30

## 2015-06-13 MED ORDER — DICYCLOMINE HCL 20 MG PO TABS: 20.0000 mg | ORAL_TABLET | Freq: Two times a day (BID) | ORAL | Status: DC

## 2015-06-13 MED ORDER — PANTOPRAZOLE SODIUM 40 MG IV SOLR
40.0000 mg | Freq: Once | INTRAVENOUS | Status: AC
  Administered 2015-06-13: 40 mg via INTRAVENOUS
  Filled 2015-06-13: qty 40

## 2015-06-13 MED ORDER — OMEPRAZOLE 20 MG PO CPDR: 20.0000 mg | DELAYED_RELEASE_CAPSULE | Freq: Every day | ORAL | Status: DC

## 2015-06-13 MED ORDER — MORPHINE SULFATE (PF) 4 MG/ML IV SOLN
4.0000 mg | Freq: Once | INTRAVENOUS | Status: AC
  Administered 2015-06-13: 4 mg via INTRAVENOUS
  Filled 2015-06-13: qty 1

## 2015-06-13 MED ORDER — GUAIFENESIN 100 MG/5ML PO SOLN
10.0000 mL | Freq: Once | ORAL | Status: DC
  Filled 2015-06-13: qty 10

## 2015-06-13 MED ORDER — HYDROCODONE-ACETAMINOPHEN 5-325 MG PO TABS: 1.0000 | ORAL_TABLET | Freq: Once | ORAL | Status: DC

## 2015-06-13 MED ORDER — PROMETHAZINE HCL 25 MG/ML IJ SOLN
25.0000 mg | Freq: Once | INTRAMUSCULAR | Status: AC
  Administered 2015-06-13: 25 mg via INTRAVENOUS
  Filled 2015-06-13: qty 1

## 2015-06-13 MED ORDER — PROMETHAZINE HCL 25 MG PO TABS: 25.0000 mg | ORAL_TABLET | Freq: Four times a day (QID) | ORAL | Status: DC | PRN

## 2015-06-13 MED ORDER — ONDANSETRON 4 MG PO TBDP: 8.0000 mg | ORAL_TABLET | Freq: Once | ORAL | Status: DC

## 2015-06-13 MED ORDER — SODIUM CHLORIDE 0.9 % IV BOLUS (SEPSIS)
1000.0000 mL | Freq: Once | INTRAVENOUS | Status: AC
  Administered 2015-06-13: 1000 mL via INTRAVENOUS

## 2015-06-13 MED ORDER — ONDANSETRON HCL 4 MG/2ML IJ SOLN: 4.0000 mg | Freq: Once | INTRAMUSCULAR | Status: DC

## 2015-06-13 NOTE — ED Notes (Signed)
PA at bedside.

## 2015-06-13 NOTE — ED Provider Notes (Signed)
CSN: [id number]     Arrival date & time 06/13/15  0806 History   First MD Initiated Contact with Patient 06/13/15 0809     Chief Complaint  Patient presents with  . Emesis  . Abdominal Pain    HPI  Mr. Steven Burch is an 19 y.o. male with reported history of gastric ulcers who presents to the ED for evaluation of abdominal pain with associated N/V. He states his symptoms began four days ago. He reports that he has sharp epigastric abdominal pain. He states he cannot tolerate anything PO and has had too many episodes of emesis a day to count. He denies diarrhea. States last BM was this morning and was normal. Denies bloody emesis. Denies BRBPR or dark, tarry stools. Of note pt has been seen multiple times in the ED and at [facility] for similar complaints. He has been referred to GI but has been unable to follow-up. He states he is trying to re-establish primary care currently and then get a referral to a GI specialist. He has not had an EGD. He has had no prior abdominal imaging. Prior episodes have been thought to be related to possible PUD/acid reflux or cyclical vomiting syndrome secondary to marijuana use. Pt states he has not used MJ in several months. Denies EtOH or other drugs. States he has PO Phenergan at home but has been unable to keep it down. Denies fever, chills, chest pain, SOB. Denies dysuria.   Past Medical History  Diagnosis Date  . Multiple gastric ulcers    Past Surgical History  Procedure Laterality Date  . Orthopedic surgery     Family History  Problem Relation Age of Onset  . Heart disease Other    Social History  Substance Use Topics  . Smoking status: Never Smoker   . Smokeless tobacco: Never Used  . Alcohol Use: No    Review of Systems  All other systems reviewed and are negative.     Allergies  Review of patient's allergies indicates no known allergies.  Home Medications   Prior to Admission medications   Not on File   BP 129/69 mmHg  Pulse 61  Temp(Src)  99.2 F (37.3 C) (Oral)  Resp 16  Ht  (1.727 m)  Wt 72.576 kg  BMI 24.33 kg/m2  SpO2 100% Physical Exam  Constitutional: He is oriented to person, place, and time.  HENT:  Right Ear: External ear normal.  Left Ear: External ear normal.  Nose: Nose normal.  Mouth/Throat: Oropharynx is clear and moist. No oropharyngeal exudate.  Eyes: Conjunctivae and EOM are normal. Pupils are equal, round, and reactive to light.  Neck: Normal range of motion. Neck supple.  Cardiovascular: Normal rate, regular rhythm, normal heart sounds and intact distal pulses.   Pulmonary/Chest: Effort normal and breath sounds normal. No respiratory distress. He exhibits no tenderness.  Abdominal: Soft. Bowel sounds are normal. He exhibits no distension. There is tenderness in the right upper quadrant, right lower quadrant, epigastric area, periumbilical area and left upper quadrant. There is no rebound, no guarding and no CVA tenderness.  Musculoskeletal: He exhibits no edema.  Neurological: He is alert and oriented to person, place, and time. No cranial nerve deficit.  Skin: Skin is warm and dry. There is pallor.  Psychiatric: He has a normal mood and affect.  Nursing note and vitals reviewed.   ED Course  Procedures (including critical care time) Labs Review Labs Reviewed  COMPREHENSIVE METABOLIC PANEL - Abnormal; Notable for the following:  Glucose, Bld 149 (*)    Calcium 10.9 (*)    Total Protein 8.5 (*)    Albumin 5.1 (*)    AST 44 (*)    Total Bilirubin 1.3 (*)    Anion gap 17 (*)    All other components within normal limits  CBC WITH DIFFERENTIAL/PLATELET - Abnormal; Notable for the following:    WBC 15.8 (*)    Neutro Abs 13.9 (*)    All other components within normal limits  URINALYSIS, ROUTINE W REFLEX MICROSCOPIC (NOT AT [facility]) - Abnormal; Notable for the following:    Color, Urine AMBER (*)    APPearance CLOUDY (*)    Specific Gravity, Urine 1.037 (*)    pH 8.5 (*)    Bilirubin  Urine SMALL (*)    Ketones, ur >80 (*)    Protein, ur 100 (*)    Leukocytes, UA SMALL (*)    All other components within normal limits  URINE RAPID DRUG SCREEN, HOSP PERFORMED - Abnormal; Notable for the following:    Benzodiazepines POSITIVE (*)    Tetrahydrocannabinol POSITIVE (*)    All other components within normal limits  LIPASE, BLOOD  URINE MICROSCOPIC-ADD ON    Imaging Review No results found. I have personally reviewed and evaluated these images and lab results as part of my medical decision-making.   EKG Interpretation None      MDM   Final diagnoses:  Generalized abdominal pain  Non-intractable cyclical vomiting with nausea    Pt is an 19 y.o. male who is presenting with acute episode of recurrent/chronic epigastric pain with N/V. He denies recent marijuana use. I suspect possible PUD vs GERD. However, pt is quite tender on exam in multiple areas including RUQ, LUQ, epigastrum, and LLQ. Will check CBC, CMP, lipase, UA, UDS. I will give pt 1L NS bolus, IV phenergan, morphine, protonix. Will trial GI cocktail. I suspect that pt really needs GI f/u for EGD. However, am considering possible abdominal CT given diffuse tenderness and clinical apperance. However, pt is afebrile and not tachycardic, and has chronic recurrence of similar episodes, so I will wait for labs and then re-assess.   Labs show evidence of dehydration. CBC does show a white count of 15.8 though this is actually improved from prior. His abdominal tenderness has resolved on re-eval. Pt is able to tolerate fluids. He is afebrile Given resolution of symptoms and chronic/intermittent nature of pt's pain and n/v, will hold off on abdominal imaging at this time. UDS does show positive benzo and THC. I talked to pt about this as he denied recent drug use to me during our initial conversation. He now admits to smoking "maybe one hit of a blunt a few days ago." I discussed with pt that while I do suspect he has some  underlying GI pathology the MJ use is likely exacerbating his symptoms and strongly encouraged cessation of all drug use. Pt states he is trying to re-establish primary care so he can get a referral to GI. I will give pt GI contact info for direct contact if financially feasible.   Carlene CoriaSerena Y Rupa Lagan, PA-C 06/15/15 1525  Cathren LaineKevin Steinl, MD 06/16/15 [phone]

## 2015-06-13 NOTE — ED Notes (Signed)
Pt reports N/V x 3 days with abdominal pain.  Pt reports unable to tolerate PO intake.  Pt denies diarrhea.  NAD noted.

## 2015-06-13 NOTE — ED Notes (Signed)
Dr. Steinl MD at bedside. 

## 2015-06-13 NOTE — Discharge Instructions (Signed)
You were seen in the emergency room for abdominal pain, nausea, and vomiting. We were able to control your pain and nausea. I suspect your symptoms are due to several causes: I think you might have possible acid reflux or ulcers but your occasional marijuana use is probably exacerbating the pain and nausea/vomiting. I highly encourage you to stop using marijuana and other drugs completely. I will give you several prescriptions to help with your symptoms. Please follow up with your primary care provider ASAP so they can refer you to GI as we discussed. If financially feasible you may contact [facility] directly. I included their contact info in this packet.Return to the ER for new or worsening symptoms.

## 2015-10-01 ENCOUNTER — Encounter (HOSPITAL_COMMUNITY): Payer: Self-pay | Admitting: Emergency Medicine

## 2015-10-01 ENCOUNTER — Emergency Department (HOSPITAL_COMMUNITY)
Admission: EM | Admit: 2015-10-01 | Discharge: 2015-10-01 | Disposition: A | Discharge status: Home / Self Care | Payer: Medicaid Other | Attending: Emergency Medicine | Admitting: Emergency Medicine

## 2015-10-01 DIAGNOSIS — R1013 Epigastric pain: Secondary | ICD-10-CM

## 2015-10-01 DIAGNOSIS — K2970 Gastritis, unspecified, without bleeding: Secondary | ICD-10-CM | POA: Diagnosis not present

## 2015-10-01 LAB — COMPREHENSIVE METABOLIC PANEL
ALBUMIN: 4.8 g/dL (ref 3.5–5.0)
ALK PHOS: 66 U/L (ref 38–126)
ALT: 22 U/L (ref 17–63)
AST: 24 U/L (ref 15–41)
Anion gap: 10 (ref 5–15)
BUN: 10 mg/dL (ref 6–20)
CALCIUM: 9.9 mg/dL (ref 8.9–10.3)
CHLORIDE: 105 mmol/L (ref 101–111)
CO2: 26 mmol/L (ref 22–32)
CREATININE: 1.01 mg/dL (ref 0.61–1.24)
GFR calc non Af Amer: >60 mL/min (ref >60)
GLUCOSE: 99 mg/dL (ref 65–99)
Potassium: 4 mmol/L (ref 3.5–5.1)
SODIUM: 141 mmol/L (ref 135–145)
Total Bilirubin: 1.1 mg/dL (ref 0.3–1.2)
Total Protein: 7.5 g/dL (ref 6.5–8.1)

## 2015-10-01 LAB — URINALYSIS, ROUTINE W REFLEX MICROSCOPIC
BILIRUBIN URINE: NEGATIVE
GLUCOSE, UA: NEGATIVE mg/dL
HGB URINE DIPSTICK: NEGATIVE
Ketones, ur: 40 mg/dL — AB
Leukocytes, UA: NEGATIVE
Nitrite: NEGATIVE
PROTEIN: NEGATIVE mg/dL
Specific Gravity, Urine: 1.015 (ref 1.005–1.030)
pH: 7.5 (ref 5.0–8.0)

## 2015-10-01 LAB — CBC
HCT: 45.1 % (ref 39.0–52.0)
Hemoglobin: 14.9 g/dL (ref 13.0–17.0)
MCH: 28.1 pg (ref 26.0–34.0)
MCHC: 33 g/dL (ref 30.0–36.0)
MCV: 84.9 fL (ref 78.0–100.0)
PLATELETS: 223 10*3/uL (ref 150–400)
RBC: 5.31 MIL/uL (ref 4.22–5.81)
RDW: 13.3 % (ref 11.5–15.5)
WBC: 7.4 10*3/uL (ref 4.0–10.5)

## 2015-10-01 LAB — LIPASE, BLOOD: LIPASE: 17 U/L (ref 11–51)

## 2015-10-01 MED ORDER — RANITIDINE HCL 150 MG PO TABS: 150.0000 mg | ORAL_TABLET | Freq: Two times a day (BID) | ORAL | Status: DC

## 2015-10-01 MED ORDER — GI COCKTAIL ~~LOC~~
30.0000 mL | Freq: Once | ORAL | Status: AC
  Administered 2015-10-01: 30 mL via ORAL
  Filled 2015-10-01: qty 30

## 2015-10-01 MED ORDER — PROMETHAZINE HCL 25 MG PO TABS: 25.0000 mg | ORAL_TABLET | Freq: Three times a day (TID) | ORAL | Status: DC | PRN

## 2015-10-01 MED ORDER — OMEPRAZOLE 20 MG PO CPDR: 20.0000 mg | DELAYED_RELEASE_CAPSULE | Freq: Every day | ORAL | Status: DC

## 2015-10-01 MED ORDER — ONDANSETRON 4 MG PO TBDP
4.0000 mg | ORAL_TABLET | Freq: Once | ORAL | Status: AC
  Administered 2015-10-01: 4 mg via ORAL
  Filled 2015-10-01: qty 1

## 2015-10-01 NOTE — ED Provider Notes (Signed)
CSN: [id number]     Arrival date & time 10/01/15  1447 History   First MD Initiated Contact with Patient 10/01/15 1808     Chief Complaint  Patient presents with  . Abdominal Pain  . Emesis     (Consider location/radiation/quality/duration/timing/severity/associated sxs/prior Treatment) HPI Comments: 20 year old male with past medical history of reported stomach ulcers who presents with a several day history of epigastric abdominal pain. He describes the pain as an aching, gnawing sensation that gets worse when he lays flat as well as when he eats, particularly spicy foods. He has a history of ulcers in the past per his report, but has never had an EGD. Denies any difficulty or pain with swallowing. Denies any fevers or chills. Denies any hematemesis or hematochezia.  Patient is a 20 y.o. male presenting with abdominal pain.  Abdominal Pain Pain location:  Epigastric Pain quality: aching and gnawing   Pain radiates to:  Does not radiate Pain severity:  Moderate Onset quality:  Gradual Duration:  2 days Timing:  Constant Progression:  Worsening Chronicity:  New Context: not sick contacts and not suspicious food intake   Relieved by:  Nothing Worsened by:  Palpation and eating Ineffective treatments:  None tried Associated symptoms: nausea and vomiting   Associated symptoms: no chest pain, no chills, no cough, no dysuria, no fatigue, no fever and no shortness of breath   Risk factors: has not had multiple surgeries     Past Medical History  Diagnosis Date  . Multiple gastric ulcers    Past Surgical History  Procedure Laterality Date  . Orthopedic surgery     Family History  Problem Relation Age of Onset  . Heart disease Other    Social History  Substance Use Topics  . Smoking status: Never Smoker   . Smokeless tobacco: Never Used  . Alcohol Use: No    Review of Systems  Constitutional: Negative for fever, chills and fatigue.  HENT: Negative for congestion and  rhinorrhea.   Eyes: Negative for visual disturbance.  Respiratory: Negative for cough and shortness of breath.   Cardiovascular: Negative for chest pain.  Gastrointestinal: Positive for nausea, vomiting and abdominal pain.  Genitourinary: Negative for dysuria and flank pain.  Musculoskeletal: Negative for neck pain and neck stiffness.  Skin: Negative for rash.  Neurological: Negative for syncope, weakness and headaches.      Allergies  Review of patient's allergies indicates no known allergies.  Home Medications   Prior to Admission medications   Medication Sig Start Date End Date Taking? Authorizing Provider  magnesium hydroxide (MILK OF MAGNESIA) 400 MG/5ML suspension Take 15 mLs by mouth daily as needed for indigestion.   Yes Historical Provider, MD  omeprazole (PRILOSEC) 20 MG capsule Take 1 capsule (20 mg total) by mouth daily. 10/01/15   Shaune Pollack, MD  promethazine (PHENERGAN) 25 MG tablet Take 1 tablet (25 mg total) by mouth every 8 (eight) hours as needed for nausea or vomiting. 10/01/15   Shaune Pollack, MD  ranitidine (ZANTAC) 150 MG tablet Take 1 tablet (150 mg total) by mouth 2 (two) times daily. 10/01/15   Shaune Pollack, MD   BP 115/69 mmHg  Pulse 55  Temp(Src) 98.9 F (37.2 C) (Oral)  Resp 18  Ht  (1.727 m)  Wt 72.576 kg  BMI 24.33 kg/m2  SpO2 98% Physical Exam  Constitutional: He is oriented to person, place, and time. He appears well-developed and well-nourished. No distress.  HENT:  Head: Normocephalic.  Mouth/Throat:  No oropharyngeal exudate.  Eyes: Pupils are equal, round, and reactive to light. Right eye exhibits no discharge. Left eye exhibits no discharge.  Neck: Normal range of motion. Neck supple.  Cardiovascular: Normal rate, regular rhythm, normal heart sounds and intact distal pulses.  Exam reveals no friction rub.   No murmur heard. Pulmonary/Chest: Effort normal and breath sounds normal. No respiratory distress. He has no wheezes. He has  no rales.  Abdominal: Soft. He exhibits no distension. There is tenderness (Mild, epigastric). There is no rebound and no guarding.  Musculoskeletal: He exhibits no edema.  Neurological: He is alert and oriented to person, place, and time.  Skin: Skin is warm. No rash noted.  Nursing note and vitals reviewed.   ED Course  Procedures (including critical care time) Labs Review Labs Reviewed  URINALYSIS, ROUTINE W REFLEX MICROSCOPIC (NOT AT [facility]) - Abnormal; Notable for the following:    Ketones, ur 40 (*)    All other components within normal limits  LIPASE, BLOOD  COMPREHENSIVE METABOLIC PANEL  CBC    Imaging Review No results found. I have personally reviewed and evaluated these images and lab results as part of my medical decision-making.   EKG Interpretation None      MDM  20 year old male with past medical history of reported peptic ulcer disease and chronic gastritis who presents with acute on chronic epigastric pain with nausea and vomiting. Vital signs are stable and within normal limits. Examination shows mild epigastric tenderness but no rebound or guarding. Labs sent in triage reviewed and show no leukocytosis as well as no signs of anemia and he denies any hematemesis, hematochezia, melena or signs of significant GI bleed. His CMP shows no evidence of transaminitis and he has no right upper quadrant tenderness to suggest cholecystitis. Lipase is normal making pancreatitis unlikely. His abdomen is otherwise soft and nondistended. Reviewed previous records and patient has had a negative H. pylori in the past. Of note, he has not been taking his antacids as he ran out of his prescription. Denies any NSAID use. Denies any alcohol use. Symptoms are markedly improved with GI cocktail. Will discharge with PPI and Zantac and have patient follow up with PCP. Patient is agreeable with this plan. Strict return precautions were given in detail.  Clinical Impression: 1. Gastritis   2.  Epigastric abdominal pain     Disposition: Discharge  Condition: Good  I have discussed the results, Dx and Tx plan with the pt(& family if present). He/she/they expressed understanding and agree(s) with the plan. Discharge instructions discussed at great length. Strict return precautions discussed and pt &/or family have verbalized understanding of the instructions. No further questions at time of discharge.    Discharge Medication List as of 10/01/2015  6:49 PM    START taking these medications   Details  promethazine (PHENERGAN) 25 MG tablet Take 1 tablet (25 mg total) by mouth every 8 (eight) hours as needed for nausea or vomiting., Starting 10/01/2015, Until Discontinued, Print    ranitidine (ZANTAC) 150 MG tablet Take 1 tablet (150 mg total) by mouth 2 (two) times daily., Starting 10/01/2015, Until Discontinued, Print        [facility] [address] [place] [id number] [phone]  Follow-up with your doctor in 3-5 days as needed. If you need a primary care physician, call this number to set up an appointment   Pt seen in conjunction with Dr. Kirke CorinMiller   Zayla Agar  Erma Heritage, MD 10/02/15 1308  Eber Hong, MD 10/04/15 [phone]

## 2015-10-01 NOTE — ED Notes (Signed)
Pt alert no distress 

## 2015-10-01 NOTE — ED Provider Notes (Signed)
I saw and evaluated the patient, reviewed the resident's note and I agree with the findings and plan.  Pertinent History: The patient is a 20 year old male, he is currently having epigastric discomfort, burning sensation, feels it in the back, feels it going up his chest, often has some acid-like vomitus, this comes and goes, gets better when he takes his antacid medications however over the last several days he has had increased symptoms, he has not been able to take his normal medications, he has not had anything to eat in 24 hours because he is having some vomiting. There is no radiation of the pain to the lower abdomen, testicles, scrotum, no coughing fevers or chills  On exam he has mild epigastric ttp,  No lower abd ttp, clear lungs Normal HR, GI cocktail with resolution of symptoms.  Stable for d/c with H2 blockers Pt in agreement PO trial prior to d/c   Final diagnoses:  Gastritis  Epigastric abdominal pain      Eber HongBrian Merril Isakson, MD 10/04/15 [phone]

## 2015-10-01 NOTE — ED Notes (Signed)
Patient states has been having abdominal pain and emesis since last night.  Patient states this is a recurrent situation for him in the last year.  "They think I have ulcers".

## 2015-10-24 ENCOUNTER — Emergency Department (HOSPITAL_COMMUNITY)
Admission: EM | Admit: 2015-10-24 | Discharge: 2015-10-24 | Disposition: A | Discharge status: Home / Self Care | Payer: Medicaid Other | Attending: Emergency Medicine | Admitting: Emergency Medicine

## 2015-10-24 ENCOUNTER — Emergency Department (HOSPITAL_COMMUNITY): Payer: Medicaid Other

## 2015-10-24 ENCOUNTER — Encounter (HOSPITAL_COMMUNITY): Payer: Self-pay | Admitting: Emergency Medicine

## 2015-10-24 DIAGNOSIS — R112 Nausea with vomiting, unspecified: Secondary | ICD-10-CM | POA: Diagnosis not present

## 2015-10-24 DIAGNOSIS — G8929 Other chronic pain: Secondary | ICD-10-CM | POA: Insufficient documentation

## 2015-10-24 DIAGNOSIS — R1011 Right upper quadrant pain: Secondary | ICD-10-CM | POA: Diagnosis not present

## 2015-10-24 DIAGNOSIS — Z79899 Other long term (current) drug therapy: Secondary | ICD-10-CM | POA: Diagnosis not present

## 2015-10-24 DIAGNOSIS — R1013 Epigastric pain: Secondary | ICD-10-CM | POA: Diagnosis present

## 2015-10-24 DIAGNOSIS — J029 Acute pharyngitis, unspecified: Secondary | ICD-10-CM | POA: Insufficient documentation

## 2015-10-24 DIAGNOSIS — Z8719 Personal history of other diseases of the digestive system: Secondary | ICD-10-CM | POA: Diagnosis not present

## 2015-10-24 LAB — COMPREHENSIVE METABOLIC PANEL
ALT: 20 U/L (ref 17–63)
AST: 53 U/L — ABNORMAL HIGH (ref 15–41)
Albumin: 5.4 g/dL — ABNORMAL HIGH (ref 3.5–5.0)
Alkaline Phosphatase: 60 U/L (ref 38–126)
Anion gap: 16 — ABNORMAL HIGH (ref 5–15)
BUN: 21 mg/dL — ABNORMAL HIGH (ref 6–20)
CO2: 23 mmol/L (ref 22–32)
Calcium: 10.5 mg/dL — ABNORMAL HIGH (ref 8.9–10.3)
Chloride: 102 mmol/L (ref 101–111)
Creatinine, Ser: 1.11 mg/dL (ref 0.61–1.24)
GFR calc Af Amer: >60 mL/min (ref >60)
GFR calc non Af Amer: >60 mL/min (ref >60)
Glucose, Bld: 114 mg/dL — ABNORMAL HIGH (ref 65–99)
Potassium: 3.4 mmol/L — ABNORMAL LOW (ref 3.5–5.1)
Sodium: 141 mmol/L (ref 135–145)
Total Bilirubin: 1.3 mg/dL — ABNORMAL HIGH (ref 0.3–1.2)
Total Protein: 8.4 g/dL — ABNORMAL HIGH (ref 6.5–8.1)

## 2015-10-24 LAB — CBC WITH DIFFERENTIAL/PLATELET
Basophils Absolute: 0 10*3/uL (ref 0.0–0.1)
Basophils Relative: 0 %
Eosinophils Absolute: 0 10*3/uL (ref 0.0–0.7)
Eosinophils Relative: 0 %
HCT: 47 % (ref 39.0–52.0)
Hemoglobin: 15.9 g/dL (ref 13.0–17.0)
Lymphocytes Relative: 10 %
Lymphs Abs: 1.3 10*3/uL (ref 0.7–4.0)
MCH: 28.4 pg (ref 26.0–34.0)
MCHC: 33.8 g/dL (ref 30.0–36.0)
MCV: 83.9 fL (ref 78.0–100.0)
Monocytes Absolute: 0.8 10*3/uL (ref 0.1–1.0)
Monocytes Relative: 6 %
Neutro Abs: 10.6 10*3/uL — ABNORMAL HIGH (ref 1.7–7.7)
Neutrophils Relative %: 84 %
Platelets: 244 10*3/uL (ref 150–400)
RBC: 5.6 MIL/uL (ref 4.22–5.81)
RDW: 13.3 % (ref 11.5–15.5)
WBC: 12.7 10*3/uL — ABNORMAL HIGH (ref 4.0–10.5)

## 2015-10-24 LAB — LIPASE, BLOOD: Lipase: 16 U/L (ref 11–51)

## 2015-10-24 MED ORDER — SODIUM CHLORIDE 0.9 % IV BOLUS (SEPSIS)
1000.0000 mL | Freq: Once | INTRAVENOUS | Status: AC
  Administered 2015-10-24: 1000 mL via INTRAVENOUS

## 2015-10-24 MED ORDER — GI COCKTAIL ~~LOC~~
30.0000 mL | Freq: Once | ORAL | Status: AC
  Administered 2015-10-24: 30 mL via ORAL
  Filled 2015-10-24: qty 30

## 2015-10-24 MED ORDER — ONDANSETRON HCL 4 MG/2ML IJ SOLN
4.0000 mg | Freq: Once | INTRAMUSCULAR | Status: AC
  Administered 2015-10-24: 4 mg via INTRAVENOUS
  Filled 2015-10-24: qty 2

## 2015-10-24 NOTE — ED Notes (Signed)
Pt reports "brownish red" emesis x 2 days with abdominal pain. Pt denies diarrhea. Pt has hx of stomach ulcer. Pt also reports not being able to hold anything down x 2 days and generalized weakness.

## 2015-10-24 NOTE — Discharge Instructions (Signed)
Continue to take your at home medications. Please follow-up with [facility] for further evaluation and treatment of your symptoms. Please call the number in your discharge paperwork for help with setting neuropathy primary care provider. Please return to emergency department if you are not able to control your nausea and vomiting and tolerate your medications and fluids.   Abdominal Pain, Adult Many things can cause abdominal pain. Usually, abdominal pain is not caused by a disease and will improve without treatment. It can often be observed and treated at home. Your health care provider will do a physical exam and possibly order blood tests and X-rays to help determine the seriousness of your pain. However, in many cases, more time must pass before a clear cause of the pain can be found. Before that point, your health care provider may not know if you need more testing or further treatment. HOME CARE INSTRUCTIONS Monitor your abdominal pain for any changes. The following actions may help to alleviate any discomfort you are experiencing:  Only take over-the-counter or prescription medicines as directed by your health care provider.  Do not take laxatives unless directed to do so by your health care provider.  Try a clear liquid diet (broth, tea, or water) as directed by your health care provider. Slowly move to a bland diet as tolerated. SEEK MEDICAL CARE IF:  You have unexplained abdominal pain.  You have abdominal pain associated with nausea or diarrhea.  You have pain when you urinate or have a bowel movement.  You experience abdominal pain that wakes you in the night.  You have abdominal pain that is worsened or improved by eating food.  You have abdominal pain that is worsened with eating fatty foods.  You have a fever. SEEK IMMEDIATE MEDICAL CARE IF:  Your pain does not go away within 2 hours.  You keep throwing up (vomiting).  Your pain is felt only in portions of  the abdomen, such as the right side or the left lower portion of the abdomen.  You pass bloody or black tarry stools. MAKE SURE YOU:  Understand these instructions.  Will watch your condition.  Will get help right away if you are not doing well or get worse.   This information is not intended to replace advice given to you by your health care provider. Make sure you discuss any questions you have with your health care provider.   Document Released: 03/09/2005 Document Revised: 02/18/2015 Document Reviewed: 02/06/2013 Elsevier Interactive Patient Education [employer]2016 Else[employer] Inc.

## 2015-10-24 NOTE — ED Notes (Signed)
Pt transported to [facility]

## 2015-10-24 NOTE — ED Notes (Signed)
Pt is in stable condition upon d/c and ambulates from ED. 

## 2015-10-24 NOTE — ED Provider Notes (Signed)
CSN: [id number]     Arrival date & time 10/24/15  1309 History   First MD Initiated Contact with Patient 10/24/15 1327     Chief Complaint  Patient presents with  . Emesis  . Abdominal Pain     (Consider location/radiation/quality/duration/timing/severity/associated sxs/prior Treatment) HPI Comments: Patient is a 20 year old male with history of reported stomach ulcers who presents with acute on chronic epigastric pain, nausea, vomiting for the past 2 days. The patient states he has not been able to keep down anything, including fluids or his medicines. He has tried taking his at-home promethazine, which he cannot keep down. He describes his abdominal pain as constant and stabbing in nature and rates it as a 6/10. He states his pain is worse when he is about to vomit. Patient has associated sore throat since onset. He thinks he may have seen some blood in his vomit today and describes it was "brownish red". He denies diarrhea or bloody stools. [facility] and a fecal occult was done. It was negative. He was given a Zofran injection which did not help his symptoms. Patient denies chest pain, shortness of breath, dysuria. He states he has not had any recent NSAIDs and he has not smoked marijuana in the past month.  Patient is a 20 y.o. male presenting with vomiting and abdominal pain. The history is provided by the patient.  Emesis Associated symptoms: abdominal pain and sore throat   Associated symptoms: no chills, no diarrhea and no headaches   Abdominal Pain Associated symptoms: nausea, sore throat and vomiting   Associated symptoms: no chest pain, no chills, no diarrhea, no dysuria, no fever and no shortness of breath     Past Medical History  Diagnosis Date  . Multiple gastric ulcers    Past Surgical History  Procedure Laterality Date  . Orthopedic surgery     Family History  Problem Relation Age of Onset  . Heart disease Other    Social History    Substance Use Topics  . Smoking status: Never Smoker   . Smokeless tobacco: Never Used  . Alcohol Use: No    Review of Systems  Constitutional: Negative for fever and chills.  HENT: Positive for sore throat. Negative for facial swelling.   Respiratory: Negative for shortness of breath.   Cardiovascular: Negative for chest pain.  Gastrointestinal: Positive for nausea, vomiting and abdominal pain. Negative for diarrhea.  Genitourinary: Negative for dysuria.  Musculoskeletal: Negative for back pain.  Skin: Negative for rash and wound.  Neurological: Negative for headaches.  Psychiatric/Behavioral: The patient is not nervous/anxious.       Allergies  Review of patient's allergies indicates no known allergies.  Home Medications   Prior to Admission medications   Medication Sig Start Date End Date Taking? Authorizing Provider  omeprazole (PRILOSEC) 20 MG capsule Take 1 capsule (20 mg total) by mouth daily. 10/01/15  Yes Shaune Pollack, MD  ranitidine (ZANTAC) 150 MG tablet Take 1 tablet (150 mg total) by mouth 2 (two) times daily. 10/01/15  Yes Shaune Pollack, MD   BP 112/51 mmHg  Pulse 71  Temp(Src) 97.9 F (36.6 C) (Oral)  Resp 16  Ht  (1.727 m)  Wt 72.576 kg  BMI 24.33 kg/m2  SpO2 99% Physical Exam  Constitutional: He appears well-developed and well-nourished. No distress.  HENT:  Head: Normocephalic and atraumatic.  Mouth/Throat: No oropharyngeal exudate.  Mucous membranes mildly dry  Eyes: Conjunctivae are normal. Pupils are  equal, round, and reactive to light. Right eye exhibits no discharge. Left eye exhibits no discharge. No scleral icterus.  Neck: Normal range of motion. Neck supple. No thyromegaly present.  Cardiovascular: Normal rate, regular rhythm, normal heart sounds and intact distal pulses.  Exam reveals no gallop and no friction rub.   No murmur heard. Pulmonary/Chest: Effort normal and breath sounds normal. No stridor. No respiratory distress. He has  no wheezes. He has no rales.  Abdominal: Soft. Bowel sounds are normal. He exhibits no distension. There is tenderness in the right upper quadrant and epigastric area. There is no rigidity, no rebound and no guarding.    Patient states he has some discomfort on palpation to the RLQ  Musculoskeletal: He exhibits no edema.  Lymphadenopathy:    He has no cervical adenopathy.  Neurological: He is alert. Coordination normal.  Skin: Skin is warm and dry. No rash noted. He is not diaphoretic. No pallor.  Psychiatric: He has a normal mood and affect.  Nursing note and vitals reviewed.   ED Course  Procedures (including critical care time) Labs Review Labs Reviewed  COMPREHENSIVE METABOLIC PANEL - Abnormal; Notable for the following:    Potassium 3.4 (*)    Glucose, Bld 114 (*)    BUN 21 (*)    Calcium 10.5 (*)    Total Protein 8.4 (*)    Albumin 5.4 (*)    AST 53 (*)    Total Bilirubin 1.3 (*)    Anion gap 16 (*)    All other components within normal limits  CBC WITH DIFFERENTIAL/PLATELET - Abnormal; Notable for the following:    WBC 12.7 (*)    Neutro Abs 10.6 (*)    All other components within normal limits  LIPASE, BLOOD    Imaging Review [place] Abdomen Complete  10/24/2015  CLINICAL DATA:  Elevated AST. Epigastric pain and right upper quadrant pain for 2 days. EXAM: ABDOMEN ULTRASOUND COMPLETE COMPARISON:  None applicable FINDINGS: Gallbladder: Gallbladder has a normal appearance. Gallbladder wall is 2.0 mm, within normal limits. No stones or pericholecystic fluid. No sonographic Murphy's sign. Common bile duct: Diameter: 3.0 mm Liver: No focal lesion identified. Within normal limits in parenchymal echogenicity. IVC: No abnormality visualized. Pancreas: Visualized portion unremarkable. Spleen: Size and appearance within normal limits. Right Kidney: Length: 11.9 cm. Echogenicity within normal limits. No mass or hydronephrosis visualized. Left Kidney: Length: 10.5 cm. Echogenicity within  normal limits. No mass or hydronephrosis visualized. Abdominal aorta: 1.7 cm Other findings: None. IMPRESSION: Normal evaluation of the abdomen. Electronically Signed   By: Norva PavlovElizabeth  Brown M.D.   On: 10/24/2015 16:38   I have personally reviewed and evaluated these images and lab results as part of my medical decision-making.   EKG Interpretation None      MDM   Suspect acute on chronic gastritis. CBC shows elevated but improved white count (12.7) from past exacerbations. CMP shows potassium 3.4, BUN 21, calcium 10.5, protein 8.4, albumin 5.4, AST 53., Total bilirubin 1.3. Lipase 16. Normal abdominal ultrasound. Patient pain and nausea improved with GI cocktail, Zofran in ED. Patient tolerating fluids >6oz prior to discharge. Patient to follow-up with gastroenterology. Patient to follow up and establish care with PCP. I discussed at length how to do this. Patient also evaluated by Dr. Ranae PalmsYelverton who is in agreement with plan. Patient vitals stable throughout ED course and in satisfactory condition at discharge.  Final diagnoses:  Epigastric pain  Non-intractable vomiting with nausea, vomiting of unspecified type  Emi Holes, PA-C 10/24/15 1702  Loren Racer, MD 10/26/15 Marlyne Beards

## 2015-11-16 ENCOUNTER — Encounter (HOSPITAL_COMMUNITY): Payer: Self-pay | Admitting: Emergency Medicine

## 2015-11-16 ENCOUNTER — Emergency Department (HOSPITAL_COMMUNITY)
Admission: EM | Admit: 2015-11-16 | Discharge: 2015-11-16 | Disposition: A | Discharge status: Home / Self Care | Payer: Medicaid Other | Attending: Emergency Medicine | Admitting: Emergency Medicine

## 2015-11-16 ENCOUNTER — Emergency Department (HOSPITAL_COMMUNITY): Payer: Medicaid Other

## 2015-11-16 DIAGNOSIS — Y998 Other external cause status: Secondary | ICD-10-CM | POA: Diagnosis not present

## 2015-11-16 DIAGNOSIS — Z79899 Other long term (current) drug therapy: Secondary | ICD-10-CM | POA: Insufficient documentation

## 2015-11-16 DIAGNOSIS — W108XXA Fall (on) (from) other stairs and steps, initial encounter: Secondary | ICD-10-CM | POA: Insufficient documentation

## 2015-11-16 DIAGNOSIS — Y9289 Other specified places as the place of occurrence of the external cause: Secondary | ICD-10-CM | POA: Insufficient documentation

## 2015-11-16 DIAGNOSIS — S99911A Unspecified injury of right ankle, initial encounter: Secondary | ICD-10-CM | POA: Diagnosis present

## 2015-11-16 DIAGNOSIS — Y9301 Activity, walking, marching and hiking: Secondary | ICD-10-CM | POA: Diagnosis not present

## 2015-11-16 DIAGNOSIS — S93401A Sprain of unspecified ligament of right ankle, initial encounter: Secondary | ICD-10-CM | POA: Insufficient documentation

## 2015-11-16 DIAGNOSIS — Z8719 Personal history of other diseases of the digestive system: Secondary | ICD-10-CM | POA: Diagnosis not present

## 2015-11-16 NOTE — ED Notes (Signed)
Patient's foot elevated and ice applied.

## 2015-11-16 NOTE — ED Notes (Signed)
Patient transported to X-ray 

## 2015-11-16 NOTE — ED Notes (Signed)
Hurt rt ankle last night when his dogs tripped him up last night and it hurts to bear wt

## 2015-11-16 NOTE — ED Provider Notes (Signed)
CSN: [id number]     Arrival date & time 11/16/15  1358 History  By signing my name below, I, Hollace HaywardAndrew Hiatt, attest that this documentation has been prepared under the direction and in the presence of Renne CriglerJoshua Adonus Uselman, PA-C.   Electronically Signed: Hollace HaywardAndrew Hiatt, ED Scribe. 11/16/2015. 3:43 PM.   Chief Complaint  Patient presents with  . Ankle Pain   The history is provided by the patient. No language interpreter was used.   HPI Comments: Steven Burch is a 20 y.o. male who presents to the Emergency Department complaining of gradually worsening, constant R ankle pain s/p mechanical fall that occurred 1 day ago. Pt has associated edema to the area of injury. Pt was walking down a flight of stairs when he was tripped by a dog. Pt did not fall down the full length of the stairs. He says that when he fell he land on and inverted his ankle and heard a "pop". No LOC or head injury. Pt reports pain upon ambulating and putting weight onto his leg, he also reports a sharp pain that radiates up the posterior leg. No OTC medications or home remedies tried PTA. No other known injuries at this time.   Past Medical History  Diagnosis Date  . Multiple gastric ulcers    Past Surgical History  Procedure Laterality Date  . Orthopedic surgery     Family History  Problem Relation Age of Onset  . Heart disease Other    Social History  Substance Use Topics  . Smoking status: Never Smoker   . Smokeless tobacco: Never Used  . Alcohol Use: No    Review of Systems  Constitutional: Negative for fever and activity change.  Musculoskeletal: Positive for myalgias (ankle), joint swelling and arthralgias. Negative for back pain, gait problem and neck pain.  Skin: Negative for wound.  Neurological: Negative for weakness and numbness.   Allergies  Review of patient's allergies indicates no known allergies.  Home Medications   Prior to Admission medications   Medication Sig Start Date End Date Taking?  Authorizing Provider  omeprazole (PRILOSEC) 20 MG capsule Take 1 capsule (20 mg total) by mouth daily. 10/01/15   Shaune Pollackameron Isaacs, MD  ranitidine (ZANTAC) 150 MG tablet Take 1 tablet (150 mg total) by mouth 2 (two) times daily. 10/01/15   Shaune Pollackameron Isaacs, MD   BP 116/62 mmHg  Pulse 70  Temp(Src) 97.7 F (36.5 C) (Oral)  Resp 18  Ht 5\' 8"  (1.727 m)  Wt 155 lb (70.308 kg)  BMI 23.57 kg/m2  SpO2 98%   Physical Exam  Constitutional: He appears well-developed and well-nourished.  HENT:  Head: Normocephalic and atraumatic.  Eyes: Conjunctivae are normal.  Neck: Normal range of motion. Neck supple.  Cardiovascular: Normal pulses.   Pulses:      Dorsalis pedis pulses are 2+ on the right side, and 2+ on the left side.       Posterior tibial pulses are 2+ on the right side, and 2+ on the left side.  Pulmonary/Chest: No respiratory distress.  Musculoskeletal: He exhibits tenderness. He exhibits no edema.       Right knee: Normal.       Right ankle: He exhibits decreased range of motion and swelling. Tenderness. Lateral malleolus and medial malleolus tenderness found. Achilles tendon normal.       Right lower leg: Normal.       Right foot: Normal. There is normal range of motion.  Neurological: He is alert. No sensory deficit.  Motor, sensation, and vascular distal to the injury is fully intact.   Skin: Skin is warm and dry.  Psychiatric: He has a normal mood and affect.  Nursing note and vitals reviewed.   ED Course  Procedures (including critical care time)  DIAGNOSTIC STUDIES: Oxygen Saturation is 98% on RA, normal by my interpretation.   COORDINATION OF CARE: 3:41 PM-Discussed next steps with pt including DG R ankle, air splinting, use of crutches, and icing. Pt verbalized understanding and is agreeable with the plan.   Imaging Review Dg Ankle Complete Right  11/16/2015  CLINICAL DATA:  Hurt right ankle last night.  Pain all over. EXAM: RIGHT ANKLE - COMPLETE 3+ VIEW COMPARISON:   03/20/2012 FINDINGS: There is no evidence of fracture, dislocation, or joint effusion. There is no evidence of arthropathy or other focal bone abnormality. There is severe soft tissue swelling overlying the lateral malleolus. IMPRESSION: No acute osseous injury of the right ankle. Electronically Signed   By: Elige Ko   On: 11/16/2015 15:33   I have personally reviewed and evaluated these images and lab results as part of my medical decision-making.  Patient was counseled on RICE protocol and told to rest injury, use ice for no longer than 15 minutes every hour, compress the area, and elevate above the level of their heart as much as possible to reduce swelling. Questions answered. Patient verbalized understanding.    Discussed ankle sprains and possible need for orthopedic follow-up if not improved in one week. Orthopedic referral given. Encouraged follow-up with persistent pain or difficulty walking.  Vital signs reviewed and are as follows: Filed Vitals:   11/16/15 1406 11/16/15 1551  BP: 116/62 118/70  Pulse: 70 68  Temp: 97.7 F (36.5 C)   Resp: 18 16      MDM   Final diagnoses:  Ankle sprain, right, initial encounter   Patient with ankle sprain, imaging negative. Conservative treatment and rice protocol indicated. Orthopedic follow-up if not improved in one week. Lower extremity is neurovascularly intact.  I personally performed the services described in this documentation, which was scribed in my presence. The recorded information has been reviewed and is accurate.     Renne Crigler, PA-C 11/16/15 1657  Mancel Bale, MD 11/17/15 1332

## 2015-11-16 NOTE — Discharge Instructions (Signed)
Please read and follow all provided instructions.  Your diagnoses today include:  1. Ankle sprain, right, initial encounter     Tests performed today include:  An x-ray of your ankle - does NOT show any broken bones  Vital signs. See below for your results today.   Medications prescribed:   Tylenol   Take any prescribed medications only as directed.  Home care instructions:   Follow any educational materials contained in this packet  Follow R.I.C.E. Protocol:  R - rest your injury   I  - use ice on injury without applying directly to skin  C - compress injury with bandage or splint  E - elevate the injury as much as possible  Follow-up instructions: Please follow-up with your primary care provider or the provided orthopedic (bone specialist) if you continue to have significant pain or trouble walking in 1 week. In this case you may have a severe sprain that requires further care.   Return instructions:   Please return if your toes are numb or tingling, appear gray or blue, or you have severe pain (also elevate leg and loosen splint or wrap)  Please return to the Emergency Department if you experience worsening symptoms.   Please return if you have any other emergent concerns.  Additional Information:  Your vital signs today were: BP 116/62 mmHg   Pulse 70   Temp(Src) 97.7 F (36.5 C) (Oral)   Resp 18   Ht 5\' 8"  (1.727 m)   Wt 70.308 kg   BMI 23.57 kg/m2   SpO2 98% If your blood pressure (BP) was elevated above 135/85 this visit, please have this repeated by your doctor within one month. -------------- Your caregiver has diagnosed you as suffering from an ankle sprain. Ankle sprain occurs when the ligaments that hold the ankle joint together are stretched or torn. It may take 4 to 6 weeks to heal.  For Activity: If prescribed crutches, use crutches with non-weight bearing for the first few days. Then, you may walk on your ankle as the pain allows, or as instructed.  Start gradually with weight bearing on the affected ankle. Once you can walk pain free, then try jogging. When you can run forwards, then you can try moving side-to-side. If you cannot walk without crutches in one week, you need a re-check. --------------

## 2016-03-13 ENCOUNTER — Emergency Department (HOSPITAL_COMMUNITY): Payer: Medicaid Other

## 2016-03-13 ENCOUNTER — Encounter (HOSPITAL_COMMUNITY): Payer: Self-pay | Admitting: Emergency Medicine

## 2016-03-13 ENCOUNTER — Emergency Department (HOSPITAL_COMMUNITY)
Admission: EM | Admit: 2016-03-13 | Discharge: 2016-03-13 | Disposition: A | Discharge status: Home / Self Care | Payer: Medicaid Other | Attending: Emergency Medicine | Admitting: Emergency Medicine

## 2016-03-13 DIAGNOSIS — R112 Nausea with vomiting, unspecified: Secondary | ICD-10-CM | POA: Diagnosis present

## 2016-03-13 DIAGNOSIS — K2900 Acute gastritis without bleeding: Secondary | ICD-10-CM | POA: Insufficient documentation

## 2016-03-13 LAB — CBC WITH DIFFERENTIAL/PLATELET
BASOS ABS: 0 10*3/uL (ref 0.0–0.1)
BASOS PCT: 0 %
EOS ABS: 0 10*3/uL (ref 0.0–0.7)
EOS PCT: 0 %
HCT: 46.7 % (ref 39.0–52.0)
Hemoglobin: 15.6 g/dL (ref 13.0–17.0)
Lymphocytes Relative: 21 %
Lymphs Abs: 1.7 10*3/uL (ref 0.7–4.0)
MCH: 28.5 pg (ref 26.0–34.0)
MCHC: 33.4 g/dL (ref 30.0–36.0)
MCV: 85.2 fL (ref 78.0–100.0)
MONO ABS: 0.5 10*3/uL (ref 0.1–1.0)
Monocytes Relative: 6 %
Neutro Abs: 5.7 10*3/uL (ref 1.7–7.7)
Neutrophils Relative %: 73 %
PLATELETS: 201 10*3/uL (ref 150–400)
RBC: 5.48 MIL/uL (ref 4.22–5.81)
RDW: 13 % (ref 11.5–15.5)
WBC: 8 10*3/uL (ref 4.0–10.5)

## 2016-03-13 LAB — BASIC METABOLIC PANEL
ANION GAP: 10 (ref 5–15)
BUN: 12 mg/dL (ref 6–20)
CALCIUM: 10 mg/dL (ref 8.9–10.3)
CO2: 26 mmol/L (ref 22–32)
Chloride: 101 mmol/L (ref 101–111)
Creatinine, Ser: 0.97 mg/dL (ref 0.61–1.24)
GLUCOSE: 91 mg/dL (ref 65–99)
Potassium: 4.5 mmol/L (ref 3.5–5.1)
SODIUM: 137 mmol/L (ref 135–145)

## 2016-03-13 MED ORDER — OMEPRAZOLE 20 MG PO CPDR: 20.0000 mg | DELAYED_RELEASE_CAPSULE | Freq: Every day | ORAL | 1 refills | Status: DC

## 2016-03-13 MED ORDER — PROMETHAZINE HCL 25 MG/ML IJ SOLN
25.0000 mg | Freq: Once | INTRAMUSCULAR | Status: AC
  Administered 2016-03-13: 25 mg via INTRAVENOUS
  Filled 2016-03-13: qty 1

## 2016-03-13 MED ORDER — PROMETHAZINE HCL 25 MG PO TABS: 25.0000 mg | ORAL_TABLET | Freq: Four times a day (QID) | ORAL | 0 refills | Status: DC | PRN

## 2016-03-13 MED ORDER — SODIUM CHLORIDE 0.9 % IV BOLUS (SEPSIS)
1000.0000 mL | Freq: Once | INTRAVENOUS | Status: AC
  Administered 2016-03-13: 1000 mL via INTRAVENOUS

## 2016-03-13 MED ORDER — FAMOTIDINE IN NACL 20-0.9 MG/50ML-% IV SOLN
20.0000 mg | Freq: Once | INTRAVENOUS | Status: AC
  Administered 2016-03-13: 20 mg via INTRAVENOUS
  Filled 2016-03-13: qty 50

## 2016-03-13 NOTE — ED Notes (Signed)
Patient transported to X-ray 

## 2016-03-13 NOTE — ED Notes (Signed)
Patient Alert and oriented X4. Stable and ambulatory. Patient verbalized understanding of the discharge instructions.  Patient belongings were taken by the patient.  

## 2016-03-13 NOTE — ED Triage Notes (Signed)
Pt states he has a hx of ulcers and has been taking omeprizole bid-- stopped approx 1 month ago- started having abd pain yesterday, pt also has moist productive cough with nasal congestion.

## 2016-03-13 NOTE — ED Provider Notes (Signed)
[facility]-EMERGENCY DEPT Provider Note   CSN: [id number] Arrival date & time: 03/13/16  1305     History   Chief Complaint Chief Complaint  Patient presents with  . Abdominal Pain  . Emesis    HPI Steven Burch is a 20 y.o. male.  HPI Patient presents to emergency room with nausea and vomiting.  Some epigastric pain.  Has history of ulcers stopped taking his medication about a month ago.  Also has had a productive cough with some nasal congestion.  Denies any fever.  Denies melena hematochezia or hematemesis. Past Medical History:  Diagnosis Date  . Multiple gastric ulcers     There are no active problems to display for this patient.   Past Surgical History:  Procedure Laterality Date  . ORTHOPEDIC SURGERY         Home Medications    Prior to Admission medications   Medication Sig Start Date End Date Taking? Authorizing Provider  alum & mag hydroxide-simeth (MYLANTA) 200-200-20 MG/5ML suspension Take 15-30 mLs by mouth every 6 (six) hours as needed for indigestion or heartburn.   Yes Historical Provider, MD  omeprazole (PRILOSEC) 20 MG capsule Take 1 capsule (20 mg total) by mouth daily. 03/13/16   Nelva Nay, MD  promethazine (PHENERGAN) 25 MG tablet Take 1 tablet (25 mg total) by mouth every 6 (six) hours as needed for nausea or vomiting. 03/13/16   Nelva Nay, MD  ranitidine (ZANTAC) 150 MG tablet Take 1 tablet (150 mg total) by mouth 2 (two) times daily. Patient not taking: Reported on 03/13/2016 10/01/15   Shaune Pollack, MD    Family History Family History  Problem Relation Age of Onset  . Heart disease Other     Social History Social History  Substance Use Topics  . Smoking status: Never Smoker  . Smokeless tobacco: Never Used  . Alcohol use No     Allergies   Nsaids   Review of Systems Review of Systems All other systems reviewed and are negative  Physical Exam Updated Vital Signs BP (!) 107/51   Pulse (!) 51   Temp 98.1 F (36.7 C)  (Oral)   Resp 18   Ht 5\' 8"  (1.727 m)   Wt 165 lb (74.8 kg)   SpO2 99%   BMI 25.09 kg/m   Physical Exam Physical Exam  Nursing note and vitals reviewed. Constitutional: He is oriented to person, place, and time. He appears well-developed and well-nourished. No distress.  HENT:  Head: Normocephalic and atraumatic.  Eyes: Pupils are equal, round, and reactive to light.  Neck: Normal range of motion.  Cardiovascular: Normal rate and intact distal pulses.   Pulmonary/Chest: No respiratory distress.  Faint scattered expiratory wheezes to auscultation  Abdominal: Normal appearance. He exhibits no distension.  Mild epigastric tenderness.  No rebound or guarding tenderness.  Active bowel sounds.   Musculoskeletal: Normal range of motion.  Neurological: He is alert and oriented to person, place, and time. No cranial nerve deficit.  Skin: Skin is warm and dry. No rash noted.  Psychiatric: He has a normal mood and affect. His behavior is normal.    ED Treatments / Results  Labs (all labs ordered are listed, but only abnormal results are displayed) Labs Reviewed  CBC WITH DIFFERENTIAL/PLATELET  BASIC METABOLIC PANEL    EKG  EKG Interpretation None       Radiology Dg Chest 2 View  Result Date: 03/13/2016 CLINICAL DATA:  Productive cough x 3 days, nausea/vomiting, fever yesterday. Hx  gastric ulcers for the past 2 1/2 years, GERD EXAM: CHEST  2 VIEW COMPARISON:  None. FINDINGS: The heart size and mediastinal contours are within normal limits. Both lungs are clear. No pleural effusion or pneumothorax. The visualized skeletal structures are unremarkable. IMPRESSION: No active cardiopulmonary disease. Electronically Signed   By: Amie Portlandavid  Ormond M.D.   On: 03/13/2016 14:38    Procedures Procedures (including critical care time)  Medications Ordered in ED Medications  sodium chloride 0.9 % bolus 1,000 mL (1,000 mLs Intravenous New Bag/Given 03/13/16 1410)  famotidine (PEPCID) IVPB 20 mg  premix (20 mg Intravenous New Bag/Given 03/13/16 1411)  promethazine (PHENERGAN) injection 25 mg (25 mg Intravenous Given 03/13/16 1401)     Initial Impression / Assessment and Plan / ED Course  I have reviewed the triage vital signs and the nursing notes.  Pertinent labs & imaging results that were available during my care of the patient were reviewed by me and considered in my medical decision making (see chart for details).  Clinical Course  After treatment in the ED the patient feels back to baseline and wants to go home.    Final Clinical Impressions(s) / ED Diagnoses   Final diagnoses:  Acute superficial gastritis, presence of bleeding unspecified    New Prescriptions New Prescriptions   PROMETHAZINE (PHENERGAN) 25 MG TABLET    Take 1 tablet (25 mg total) by mouth every 6 (six) hours as needed for nausea or vomiting.     Nelva Nayobert Jazzalynn Rhudy, MD 03/13/16 [phone]

## 2016-03-13 NOTE — ED Notes (Signed)
Patient returned from X-ray 

## 2016-05-13 ENCOUNTER — Emergency Department (HOSPITAL_COMMUNITY)
Admission: EM | Admit: 2016-05-13 | Discharge: 2016-05-13 | Disposition: A | Discharge status: Home / Self Care | Payer: Medicaid Other | Attending: Emergency Medicine | Admitting: Emergency Medicine

## 2016-05-13 ENCOUNTER — Encounter (HOSPITAL_COMMUNITY): Payer: Self-pay

## 2016-05-13 DIAGNOSIS — R109 Unspecified abdominal pain: Secondary | ICD-10-CM | POA: Diagnosis present

## 2016-05-13 DIAGNOSIS — K2900 Acute gastritis without bleeding: Secondary | ICD-10-CM

## 2016-05-13 LAB — CBC
HEMATOCRIT: 44.6 % (ref 39.0–52.0)
Hemoglobin: 15.3 g/dL (ref 13.0–17.0)
MCH: 29.2 pg (ref 26.0–34.0)
MCHC: 34.3 g/dL (ref 30.0–36.0)
MCV: 85.1 fL (ref 78.0–100.0)
PLATELETS: 189 10*3/uL (ref 150–400)
RBC: 5.24 MIL/uL (ref 4.22–5.81)
RDW: 13.2 % (ref 11.5–15.5)
WBC: 5.7 10*3/uL (ref 4.0–10.5)

## 2016-05-13 LAB — URINALYSIS, ROUTINE W REFLEX MICROSCOPIC
BILIRUBIN URINE: NEGATIVE
GLUCOSE, UA: NEGATIVE mg/dL
Hgb urine dipstick: NEGATIVE
KETONES UR: NEGATIVE mg/dL
Leukocytes, UA: NEGATIVE
NITRITE: NEGATIVE
PH: 6 (ref 5.0–8.0)
Protein, ur: NEGATIVE mg/dL
Specific Gravity, Urine: 1.009 (ref 1.005–1.030)

## 2016-05-13 LAB — LIPASE, BLOOD: Lipase: 19 U/L (ref 11–51)

## 2016-05-13 LAB — COMPREHENSIVE METABOLIC PANEL
ALBUMIN: 4.5 g/dL (ref 3.5–5.0)
ALK PHOS: 48 U/L (ref 38–126)
ALT: 14 U/L — AB (ref 17–63)
AST: 23 U/L (ref 15–41)
Anion gap: 7 (ref 5–15)
BILIRUBIN TOTAL: 0.4 mg/dL (ref 0.3–1.2)
BUN: 12 mg/dL (ref 6–20)
CALCIUM: 9.7 mg/dL (ref 8.9–10.3)
CO2: 26 mmol/L (ref 22–32)
CREATININE: 0.93 mg/dL (ref 0.61–1.24)
Chloride: 104 mmol/L (ref 101–111)
GFR calc Af Amer: >60 mL/min (ref >60)
GFR calc non Af Amer: >60 mL/min (ref >60)
GLUCOSE: 105 mg/dL — AB (ref 65–99)
POTASSIUM: 4.5 mmol/L (ref 3.5–5.1)
Sodium: 137 mmol/L (ref 135–145)
TOTAL PROTEIN: 6.8 g/dL (ref 6.5–8.1)

## 2016-05-13 MED ORDER — SODIUM CHLORIDE 0.9 % IV BOLUS (SEPSIS)
1000.0000 mL | Freq: Once | INTRAVENOUS | Status: AC
  Administered 2016-05-13: 1000 mL via INTRAVENOUS

## 2016-05-13 MED ORDER — ONDANSETRON HCL 4 MG/2ML IJ SOLN
4.0000 mg | Freq: Once | INTRAMUSCULAR | Status: AC
Start: 2016-05-13 — End: 2016-05-13
  Administered 2016-05-13: 4 mg via INTRAVENOUS
  Filled 2016-05-13: qty 2

## 2016-05-13 MED ORDER — FAMOTIDINE IN NACL 20-0.9 MG/50ML-% IV SOLN
20.0000 mg | INTRAVENOUS | Status: AC
  Administered 2016-05-13: 20 mg via INTRAVENOUS
  Filled 2016-05-13: qty 50

## 2016-05-13 MED ORDER — ONDANSETRON 4 MG PO TBDP
4.0000 mg | ORAL_TABLET | Freq: Once | ORAL | Status: AC | PRN
  Administered 2016-05-13: 4 mg via ORAL

## 2016-05-13 MED ORDER — ONDANSETRON 4 MG PO TBDP: 4.0000 mg | ORAL_TABLET | Freq: Three times a day (TID) | ORAL | 0 refills | Status: DC | PRN

## 2016-05-13 MED ORDER — ONDANSETRON 4 MG PO TBDP
ORAL_TABLET | ORAL | Status: AC
  Filled 2016-05-13: qty 1

## 2016-05-13 NOTE — ED Triage Notes (Signed)
Pt. Has a hx of abdominal pain with ulcers and he developed  Abdominal pain above the umbilicus yesterday and has been vomiting all night.  Pt. Also having diarrhea. Denies any blood in stool.  Skin is p/w/d  No distress noted.

## 2016-05-13 NOTE — Discharge Instructions (Signed)
Use the Zofran to help with nausea. Recommend to follow-up with your primary care doctor. You will need referral to GI office. Continue to monitor your diet and symptoms. Return here for new concerns.

## 2016-05-13 NOTE — ED Provider Notes (Signed)
[facility]-EMERGENCY DEPT Provider Note   CSN: [id number] Arrival date & time: 05/13/16  [id number]     History   Chief Complaint Chief Complaint  Patient presents with  . Abdominal Pain    HPI Steven Burch is a 20 y.o. male.  The history is provided by the patient and medical records.  Abdominal Pain   Associated symptoms include nausea and vomiting.     20 year old male with history of gastric ulcers, presenting to the ED for nausea and vomiting. Patient reports he has a long-standing history of gastric ulcers and usually has 1-2 bad days a month in which he has persistent nausea and vomiting. Reports symptoms began yesterday afternoon and he continued to have nausea and vomiting throughout the entire night. Reports some mild left upper abdominal pain but thinks it is from retching. He denies any hematemesis or coffee-ground emesis. No fever or chills. No sick contacts. Patient states he has been referred to GI multiple times in the past, however receptionist will not allow him to scheduled appointment stating he needs a proper referral from his primary care doctor. He has not had any prior abdominal surgeries. Patient reports he has been making a conscious effort to modify his diet and has not eaten spicy or significantly greasy foods in the past 2 years. He has also avoided all NSAIDs for the past 2 years as well.  Past Medical History:  Diagnosis Date  . Multiple gastric ulcers     There are no active problems to display for this patient.   Past Surgical History:  Procedure Laterality Date  . ORTHOPEDIC SURGERY         Home Medications    Prior to Admission medications   Medication Sig Start Date End Date Taking? Authorizing Provider  alum & mag hydroxide-simeth (MYLANTA) 200-200-20 MG/5ML suspension Take 15-30 mLs by mouth every 6 (six) hours as needed for indigestion or heartburn.    Historical Provider, MD  omeprazole (PRILOSEC) 20 MG capsule Take 1 capsule (20 mg total)  by mouth daily. 03/13/16   Nelva Nayobert Beaton, MD  promethazine (PHENERGAN) 25 MG tablet Take 1 tablet (25 mg total) by mouth every 6 (six) hours as needed for nausea or vomiting. 03/13/16   Nelva Nayobert Beaton, MD  ranitidine (ZANTAC) 150 MG tablet Take 1 tablet (150 mg total) by mouth 2 (two) times daily. Patient not taking: Reported on 03/13/2016 10/01/15   Shaune Pollackameron Isaacs, MD    Family History Family History  Problem Relation Age of Onset  . Heart disease Other     Social History Social History  Substance Use Topics  . Smoking status: Never Smoker  . Smokeless tobacco: Never Used  . Alcohol use No     Allergies   Nsaids   Review of Systems Review of Systems  Gastrointestinal: Positive for abdominal pain, nausea and vomiting.  All other systems reviewed and are negative.    Physical Exam Updated Vital Signs BP 110/59 (BP Location: Left Arm)   Pulse (!) 51   Temp 97.6 F (36.4 C) (Oral)   Resp 16   Ht 5\' 8"  (1.727 m)   Wt 74.8 kg   SpO2 100%   BMI 25.09 kg/m   Physical Exam  Constitutional: He is oriented to person, place, and time. He appears well-developed and well-nourished.  HENT:  Head: Normocephalic and atraumatic.  Mouth/Throat: Oropharynx is clear and moist.  Mildly dry mucous membranes  Eyes: Conjunctivae and EOM are normal. Pupils are equal, round, and reactive  to light.  Neck: Normal range of motion.  Cardiovascular: Normal rate, regular rhythm and normal heart sounds.   Pulmonary/Chest: Effort normal and breath sounds normal.  Abdominal: Soft. Bowel sounds are normal. There is no tenderness. There is no rigidity and no guarding.  Abdomen is soft, benign, no focal tenderness or peritoneal signs  Musculoskeletal: Normal range of motion.  Neurological: He is alert and oriented to person, place, and time.  Skin: Skin is warm and dry.  Psychiatric: He has a normal mood and affect.  Nursing note and vitals reviewed.    ED Treatments / Results  Labs (all labs  ordered are listed, but only abnormal results are displayed) Labs Reviewed  COMPREHENSIVE METABOLIC PANEL - Abnormal; Notable for the following:       Result Value   Glucose, Bld 105 (*)    ALT 14 (*)    All other components within normal limits  LIPASE, BLOOD  CBC  URINALYSIS, ROUTINE W REFLEX MICROSCOPIC (NOT AT [facility])    EKG  EKG Interpretation None       Radiology No results found.  Procedures Procedures (including critical care time)  Medications Ordered in ED Medications  ondansetron (ZOFRAN-ODT) 4 MG disintegrating tablet (not administered)  ondansetron (ZOFRAN-ODT) disintegrating tablet 4 mg (4 mg Oral Given 05/13/16 0824)  sodium chloride 0.9 % bolus 1,000 mL (0 mLs Intravenous Stopped 05/13/16 1100)  ondansetron (ZOFRAN) injection 4 mg (4 mg Intravenous Given 05/13/16 0959)  famotidine (PEPCID) IVPB 20 mg premix (0 mg Intravenous Stopped 05/13/16 1029)     Initial Impression / Assessment and Plan / ED Course  I have reviewed the triage vital signs and the nursing notes.  Pertinent labs & imaging results that were available during my care of the patient were reviewed by me and considered in my medical decision making (see chart for details).  Clinical Course    20 year old male here with abdominal pain. He has a history of gastric ulcers. Reports vomiting since last night. He is afebrile and nontoxic. His abdomen is soft and overall benign.  Lab work is reassuring.  Patient treated here with IVF, zofran, and pepcid with improvement of symptoms.  He is tolerating fluids well at this time.  Feel he is appropriate for discharge.  Recommended to continue diet modification and avoiding NSAIDs.  Recommended to follow-up with GI.  Discussed plan with patient, he acknowledged understanding and agreed with plan of care.  Return precautions given for new or worsening symptoms.  Final Clinical Impressions(s) / ED Diagnoses   Final diagnoses:  Abdominal pain, unspecified  abdominal location  Acute gastritis without hemorrhage, unspecified gastritis type    New Prescriptions Discharge Medication List as of 05/13/2016  1:07 PM    START taking these medications   Details  ondansetron (ZOFRAN ODT) 4 MG disintegrating tablet Take 1 tablet (4 mg total) by mouth every 8 (eight) hours as needed for nausea., Starting Fri 05/13/2016, Print         Garlon HatchetLisa M Dmonte Maher, PA-C 05/13/16 1458    Lavera Guiseana Duo Liu, MD 05/13/16 [phone]

## 2016-05-15 ENCOUNTER — Encounter (HOSPITAL_COMMUNITY): Payer: Self-pay | Admitting: Emergency Medicine

## 2016-05-15 ENCOUNTER — Emergency Department (HOSPITAL_COMMUNITY)
Admission: EM | Admit: 2016-05-15 | Discharge: 2016-05-15 | Disposition: A | Discharge status: Home / Self Care | Payer: Medicaid Other | Attending: Emergency Medicine | Admitting: Emergency Medicine

## 2016-05-15 DIAGNOSIS — Y990 Civilian activity done for income or pay: Secondary | ICD-10-CM | POA: Insufficient documentation

## 2016-05-15 DIAGNOSIS — M546 Pain in thoracic spine: Secondary | ICD-10-CM | POA: Diagnosis present

## 2016-05-15 DIAGNOSIS — Y9389 Activity, other specified: Secondary | ICD-10-CM | POA: Insufficient documentation

## 2016-05-15 DIAGNOSIS — M62830 Muscle spasm of back: Secondary | ICD-10-CM | POA: Insufficient documentation

## 2016-05-15 DIAGNOSIS — X509XXA Other and unspecified overexertion or strenuous movements or postures, initial encounter: Secondary | ICD-10-CM | POA: Insufficient documentation

## 2016-05-15 DIAGNOSIS — Y929 Unspecified place or not applicable: Secondary | ICD-10-CM | POA: Insufficient documentation

## 2016-05-15 DIAGNOSIS — Z79899 Other long term (current) drug therapy: Secondary | ICD-10-CM | POA: Insufficient documentation

## 2016-05-15 MED ORDER — METHOCARBAMOL 500 MG PO TABS
1000.0000 mg | ORAL_TABLET | Freq: Once | ORAL | Status: AC
  Administered 2016-05-15: 1000 mg via ORAL
  Filled 2016-05-15: qty 2

## 2016-05-15 MED ORDER — METHOCARBAMOL 500 MG PO TABS: 1000.0000 mg | ORAL_TABLET | Freq: Four times a day (QID) | ORAL | 0 refills | Status: DC

## 2016-05-15 MED ORDER — ACETAMINOPHEN 325 MG PO TABS
650.0000 mg | ORAL_TABLET | Freq: Once | ORAL | Status: AC
  Administered 2016-05-15: 650 mg via ORAL
  Filled 2016-05-15: qty 2

## 2016-05-15 NOTE — Discharge Instructions (Signed)
Please read and follow all provided instructions.  Your diagnoses today include:  1. Back spasm    Tests performed today include:  Vital signs - see below for your results today  Medications prescribed:   Robaxin (methocarbamol) - muscle relaxer medication  DO NOT drive or perform any activities that require you to be awake and alert because this medicine can make you drowsy.   Take any prescribed medications only as directed.  Home care instructions:   Follow any educational materials contained in this packet  Please rest, use ice or heat on your back for the next several days  Do not lift, push, pull anything more than 10 pounds for the next week  Follow-up instructions: Please follow-up with your primary care provider in the next 1 week for further evaluation of your symptoms.    Return instructions:  SEEK IMMEDIATE MEDICAL ATTENTION IF YOU HAVE:  New numbness, tingling, weakness, or problem with the use of your arms or legs  Severe back pain not relieved with medications  Loss control of your bowels or bladder  Increasing pain in any areas of the body (such as chest or abdominal pain)  Shortness of breath, dizziness, or fainting.   Worsening nausea (feeling sick to your stomach), vomiting, fever, or sweats  Any other emergent concerns regarding your health   Additional Information:  Your vital signs today were: BP 110/56 (BP Location: Left Arm)    Pulse 79    Temp 98.3 F (36.8 C) (Oral)    Resp 16    Ht 5\' 8"  (1.727 m)    Wt 72.6 kg    SpO2 99%    BMI 24.33 kg/m  If your blood pressure (BP) was elevated above 135/85 this visit, please have this repeated by your doctor within one month. --------------

## 2016-05-15 NOTE — ED Notes (Signed)
Bed: WTR6 Expected date:  Expected time:  Means of arrival:  Comments: 20 yo back pain

## 2016-05-15 NOTE — ED Triage Notes (Signed)
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 Denies fall or trauma. Pt was ambulatory at the scene. Pt is alert, oriented and appropriate.
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
## 2016-05-15 NOTE — ED Provider Notes (Signed)
[facility]-EMERGENCY DEPT Provider Note   CSN: [id number] Arrival date & time: 05/15/16  1409   By signing my name below, I, [name], attest that this documentation has been prepared under the direction and in the presence of non-physician practitioner, Rhea BleacherJosh Arnie Clingenpeel PA-C. Electronically Signed: [name], Scribe. 05/15/2016. 2:25 PM.  History   Chief Complaint Chief Complaint  Patient presents with  . Back Pain    back pain x 30 minutes   The history is provided by the patient. No language interpreter was used.    HPI Comments:  Steven Burch is a 20 y.o. male with pmhx of gastric ulcers who presents to the Emergency Department complaining of sudden-onset waxing/waning suddenly worsened middle back pain that began last night but exacerbated today. He describes his pain as a sharp stabbing pain exacerbated by palpation and movement. 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 He denies any wound or neck pain. Patient denies warning symptoms of back pain including: fecal incontinence, urinary retention or overflow incontinence, night sweats, waking from sleep with back pain, unexplained fevers or weight loss, h/o cancer, IVDU, recent trauma.     Past Medical History:  Diagnosis Date  . Multiple gastric ulcers     There are no active problems to display for this patient.   Past Surgical History:  Procedure Laterality Date  . ORTHOPEDIC SURGERY       Home Medications    Prior to Admission medications   Medication Sig Start Date End Date Taking? Authorizing Provider  alum & mag hydroxide-simeth (MYLANTA) 200-200-20 MG/5ML suspension Take 15-30 mLs by mouth every 6 (six) hours as needed for indigestion or heartburn.    Historical Provider, MD  omeprazole (PRILOSEC) 20 MG capsule Take 1 capsule (20 mg total) by mouth daily. 03/13/16   Nelva Nayobert Beaton, MD  ondansetron (ZOFRAN ODT) 4 MG  disintegrating tablet Take 1 tablet (4 mg total) by mouth every 8 (eight) hours as needed for nausea. 05/13/16   Garlon HatchetLisa M Sanders, PA-C  promethazine (PHENERGAN) 25 MG tablet Take 1 tablet (25 mg total) by mouth every 6 (six) hours as needed for nausea or vomiting. Patient not taking: Reported on 05/13/2016 03/13/16   Nelva Nayobert Beaton, MD  ranitidine (ZANTAC) 150 MG tablet Take 1 tablet (150 mg total) by mouth 2 (two) times daily. Patient not taking: Reported on 05/13/2016 10/01/15   Shaune Pollackameron Isaacs, MD    Family History Family History  Problem Relation Age of Onset  . Heart disease Other     Social History Social History  Substance Use Topics  . Smoking status: Never Smoker  . Smokeless tobacco: Never Used  . Alcohol use No     Allergies   Nsaids   Review of Systems Review of Systems  Constitutional: Negative for fever and unexpected weight change.  Gastrointestinal: Negative for constipation.       Neg for fecal incontinence  Genitourinary: Negative for difficulty urinating, flank pain and hematuria.       Negative for urinary incontinence or retention  Musculoskeletal: Positive for back pain. Negative for neck pain.  Skin: Negative for wound.  Neurological: Negative for weakness and numbness.       Negative for saddle paresthesias      Physical Exam Updated Vital Signs BP 110/56 (BP Location: Left Arm)   Pulse 79   Temp 98.3 F (36.8 C) (Oral)  Resp 16   Ht 5\' 8"  (1.727 m)   Wt 160 lb (72.6 kg)   SpO2 99%   BMI 24.33 kg/m   Physical Exam  Constitutional: He is oriented to person, place, and time. He appears well-developed and well-nourished. No distress.  HENT:  Head: Normocephalic and atraumatic.  Eyes: Conjunctivae are normal.  Neck: Normal range of motion.  Cardiovascular: Normal rate.   Pulmonary/Chest: Effort normal.  Abdominal: Soft. He exhibits no distension. There is no tenderness. There is no CVA tenderness.  Musculoskeletal: Normal range of motion.        Cervical back: Normal.       Thoracic back: He exhibits tenderness (R-sided middle thoracic) and spasm. He exhibits normal range of motion and no bony tenderness.       Lumbar back: Normal.  No step-off noted with palpation of spine.   Neurological: He is alert and oriented to person, place, and time. He has normal reflexes. No sensory deficit. He exhibits normal muscle tone.  5/5 strength in entire lower extremities bilaterally. No sensation deficit.   Skin: Skin is warm and dry.  Psychiatric: He has a normal mood and affect.  Nursing note and vitals reviewed.    ED Treatments / Results  DIAGNOSTIC STUDIES:  Oxygen Saturation is 99% on RA, normal by my interpretation.    COORDINATION OF CARE:  3:09 PM Discussed treatment plan with pt at bedside which includes pain medication, muscle relaxants and pt agreed to plan.  Procedures Procedures (including critical care time)  Medications Ordered in ED Medications  methocarbamol (ROBAXIN) tablet 1,000 mg (1,000 mg Oral Given 05/15/16 1532)  acetaminophen (TYLENOL) tablet 650 mg (650 mg Oral Given 05/15/16 1532)     Initial Impression / Assessment and Plan / ED Course  I have reviewed the triage vital signs and the nursing notes.  Pertinent labs & imaging results that were available during my care of the patient were reviewed by me and considered in my medical decision making (see chart for details).  Clinical Course    Patient seen and examined. Medications ordered.   Vital signs reviewed and are as follows: Vitals:   05/15/16 1418  BP: 110/56  Pulse: 79  Resp: 16  Temp: 98.3 F (36.8 C)   No red flag s/s of low back pain. Patient was counseled on back pain precautions and told to do activity as tolerated but do not lift, push, or pull heavy objects more than 10 pounds for the next week.  Patient counseled to use ice or heat on back for no longer than 15 minutes every hour.   Patient counseled on proper use of muscle  relaxant medication.  They were told not to drink alcohol, drive any vehicle, or do any dangerous activities while taking this medication.  Patient verbalized understanding.  Patient urged to follow-up with PCP if pain does not improve with treatment and rest or if pain becomes recurrent. Urged to return with worsening severe pain, loss of bowel or bladder control, trouble walking.   The patient verbalizes understanding and agrees with the plan.    Final Clinical Impressions(s) / ED Diagnoses   Final diagnoses:  Back spasm   Patient with back pain with spasm. No neurological deficits. Patient is ambulatory. No warning symptoms of back pain including: fecal incontinence, urinary retention or overflow incontinence, night sweats, waking from sleep with back pain, unexplained fevers or weight loss, h/o cancer, IVDU, recent trauma. No concern for cauda equina, epidural abscess, or  other serious cause of back pain. Conservative measures such as rest, ice/heat and pain medicine indicated with PCP follow-up if no improvement with conservative management.    New Prescriptions Discharge Medication List as of 05/15/2016  3:57 PM    START taking these medications   Details  methocarbamol (ROBAXIN) 500 MG tablet Take 2 tablets (1,000 mg total) by mouth 4 (four) times daily., Starting Sun 05/15/2016, Print      I personally performed the services described in this documentation, which was scribed in my presence. The recorded information has been reviewed and is accurate.    Renne CriglerJoshua Eryx Zane, PA-C 05/15/16 1738    Tilden FossaElizabeth Rees, MD 05/16/16 0110

## 2017-02-06 ENCOUNTER — Ambulatory Visit (HOSPITAL_COMMUNITY)
Admission: EM | Admit: 2017-02-06 | Discharge: 2017-02-06 | Disposition: A | Discharge status: Home / Self Care | Payer: Medicaid Other | Attending: Family Medicine | Admitting: Family Medicine

## 2017-02-06 ENCOUNTER — Encounter (HOSPITAL_COMMUNITY): Payer: Self-pay | Admitting: *Deleted

## 2017-02-06 DIAGNOSIS — S0012XA Contusion of left eyelid and periocular area, initial encounter: Secondary | ICD-10-CM

## 2017-02-06 DIAGNOSIS — H1132 Conjunctival hemorrhage, left eye: Secondary | ICD-10-CM

## 2017-02-06 NOTE — ED Provider Notes (Signed)
  [facility]   [id number] 02/06/17 Arrival Time: 1020   SUBJECTIVE:  Steven Burch is a 21 y.o. male who presents to the urgent care  with complaint of swelling and pain to his left eye. He reports he was playing a basketball game last night, when he caught an "elbow" to the eye while taking a shot. Has no loss of vision, no light sensitivity, no sensation of a shade coming down over the eye, no flashing or shooting lights, or other abnormalities.  ROS: As per HPI, remainder of ROS negative.   OBJECTIVE:  Vitals:   02/06/17 1047  BP: (!) 122/95  Pulse: 69  Resp: 17  Temp: 98 F (36.7 C)  TempSrc: Oral  SpO2: 100%     General appearance: alert; no distress HEENT: normocephalic; Periorbital hematoma around the left eye, also subconjunctival hemorrhage at the 5:00 position. Pupils are equal, round, reactive, extraocular movements remain intact, both eyes have vision of 20/20 uncorrected, funduscopic exam negative for papilledema, AV nicking, or other notable abnormality Neck: Trachea midline, no cervical lymphadenopathy Lungs: clear to auscultation bilaterally Heart: regular rate and rhythm Abdomen: soft, non-tender;  Musculoskeletal: Grossly symmetrical Skin: warm and dry Neurologic: Grossly normal Psychological:  alert and cooperative; normal mood and affect     ASSESSMENT & PLAN:  1. Subconjunctival hemorrhage of left eye   2. Black eye of left side, initial encounter    Strict follow-up guidelines given to the patient, recommend going to the ER anytime he has any change in vision, worsening pain. Recommend rest, ice to the affected area, Tylenol or ibuprofen as needed for pain  Reviewed expectations re: course of current medical issues. Questions answered. Outlined signs and symptoms indicating need for more acute intervention. Patient verbalized understanding. After Visit Summary given.    Procedures:  Labs Reviewed - No data to display  No  results found.  Allergies  Allergen Reactions  . Nsaids Other (See Comments)    Patient has STOMACH ISSUES and cannot tolerate NSAIDS (of any kind)    PMHx, SurgHx, SocialHx, Medications, and Allergies were reviewed in the Visit Navigator and updated as appropriate.       Dorena Bodo, NP 02/06/17 1547

## 2017-02-06 NOTE — ED Triage Notes (Signed)
Patient reports getting elbowed last night to left eye while playing basketball. Patient iced after incident but woke up this am with large amount of swelling and bruising. Reports mild blurred vision to left eye and decreased ability to open.

## 2017-02-06 NOTE — Discharge Instructions (Signed)
You have a subconjunctival hemorrhage, and a contusion to the eye these symptoms typically resolve in 2-4 weeks. You May  have ibuprofen or Tylenol for pain, apply ice to the affected eye 15 minutes at a time for more times a day. If at any time your vision worsens, if you have the sensation of flashing lights, increased light sensitivity, or the sensation of a shade coming down over your eye, or worsened I pain, these can be signs of medical emergency, go to the ER

## 2017-03-09 ENCOUNTER — Ambulatory Visit (HOSPITAL_COMMUNITY)
Admission: EM | Admit: 2017-03-09 | Discharge: 2017-03-09 | Disposition: A | Discharge status: Home / Self Care | Payer: Self-pay | Attending: Family Medicine | Admitting: Family Medicine

## 2017-03-09 ENCOUNTER — Encounter (HOSPITAL_COMMUNITY): Payer: Self-pay | Admitting: Family Medicine

## 2017-03-09 DIAGNOSIS — R112 Nausea with vomiting, unspecified: Secondary | ICD-10-CM

## 2017-03-09 MED ORDER — ONDANSETRON HCL 4 MG/2ML IJ SOLN
INTRAMUSCULAR | Status: AC
  Filled 2017-03-09: qty 2

## 2017-03-09 MED ORDER — ONDANSETRON HCL 4 MG/2ML IJ SOLN
4.0000 mg | Freq: Once | INTRAMUSCULAR | Status: AC
  Administered 2017-03-09: 4 mg via INTRAVENOUS

## 2017-03-09 MED ORDER — ONDANSETRON HCL 4 MG/2ML IJ SOLN: 4.0000 mg | Freq: Once | INTRAMUSCULAR | Status: DC

## 2017-03-09 MED ORDER — ONDANSETRON 4 MG PO TBDP: 4.0000 mg | ORAL_TABLET | Freq: Three times a day (TID) | ORAL | 1 refills | Status: DC | PRN

## 2017-03-09 MED ORDER — SODIUM CHLORIDE 0.9 % IV SOLN
Freq: Once | INTRAVENOUS | Status: AC
  Administered 2017-03-09: 14:00:00 via INTRAVENOUS

## 2017-03-09 NOTE — ED Provider Notes (Signed)
[facility]    CSN: [id number] Arrival date & time: 03/09/17  1239     History   Chief Complaint Chief Complaint  Patient presents with  . Emesis    HPI REMBERT BROWE is a 21 y.o. male.   Note from 06/03/2015: Pt is an 21 y.o. male who is presenting with acute episode of recurrent/chronic epigastric pain with N/V. He denies recent marijuana use. I suspect possible PUD vs GERD. However, pt is quite tender on exam in multiple areas including RUQ, LUQ, epigastrum, and LLQ. Will check CBC, CMP, lipase, UA, UDS. I will give pt 1L NS bolus, IV phenergan, morphine, protonix. Will trial GI cocktail. I suspect that pt really needs GI f/u for EGD. However, am considering possible abdominal CT given diffuse tenderness and clinical apperance. However, pt is afebrile and not tachycardic, and has chronic recurrence of similar episodes, so I will wait for labs and then re-assess.   Labs show evidence of dehydration. CBC does show a white count of 15.8 though this is actually improved from prior. His abdominal tenderness has resolved on re-eval. Pt is able to tolerate fluids. He is afebrile Given resolution of symptoms and chronic/intermittent nature of pt's pain and n/v, will hold off on abdominal imaging at this time. UDS does show positive benzo and THC. I talked to pt about this as he denied recent drug use to me during our initial conversation. He now admits to smoking "maybe one hit of a blunt a few days ago." I discussed with pt that while I do suspect he has some underlying GI pathology the MJ use is likely exacerbating his symptoms and strongly encouraged cessation of all drug use. Pt states he is trying to re-establish primary care so he can get a referral to GI. I will give pt GI contact info for direct contact if financially feasible.  Today's note:  This 21 year old male presents with an acute episode of vomiting which began at 4 AM this morning. He now feels thirsty but he can't  tolerate anything by mouth. He has some mild epigastric pain There is no diarrhea. As noted above, this is a recurring problem for the patient. He has no health insurance and works in [employer].          Past Medical History:  Diagnosis Date  . Multiple gastric ulcers     There are no active problems to display for this patient.   Past Surgical History:  Procedure Laterality Date  . ORTHOPEDIC SURGERY         Home Medications    Prior to Admission medications   Medication Sig Start Date End Date Taking? Authorizing Provider  omeprazole (PRILOSEC) 20 MG capsule Take 1 capsule (20 mg total) by mouth daily. 03/13/16  Yes Nelva Nay, MD  alum & mag hydroxide-simeth [facility]) 200-200-20 MG/5ML suspension Take 15-30 mLs by mouth every 6 (six) hours as needed for indigestion or heartburn.    [provider]  methocarbamol (ROBAXIN) 500 MG tablet Take 2 tablets (1,000 mg total) by mouth 4 (four) times daily. 05/15/16   Renne Crigler, PA-C  ondansetron (ZOFRAN ODT) 4 MG disintegrating tablet Take 1 tablet (4 mg total) by mouth every 8 (eight) hours as needed for nausea. 03/09/17   Elvina Sidle, MD    Family History Family History  Problem Relation Age of Onset  . Heart disease Other     Social History Social History  Substance Use Topics  . Smoking  status: Never Smoker  . Smokeless tobacco: Never Used  . Alcohol use No     Allergies   Nsaids   Review of Systems Review of Systems  Constitutional: Positive for fatigue.  HENT: Negative.   Respiratory: Negative.   Cardiovascular: Negative.   Gastrointestinal: Positive for abdominal pain and vomiting.  Musculoskeletal: Negative.   Neurological: Positive for weakness.  All other systems reviewed and are negative.    Physical Exam Triage Vital Signs ED Triage Vitals  Enc Vitals Group     BP      Pulse      Resp      Temp      Temp src      SpO2      Weight      Height       Head Circumference      Peak Flow      Pain Score      Pain Loc      Pain Edu?      Excl. in GC?    No data found.   Updated Vital Signs BP 126/68 (BP Location: Left Arm)   Pulse (!) 52   Temp 98.3 F (36.8 C) (Oral)   SpO2 100%     Physical Exam  Constitutional: He is oriented to person, place, and time. He appears well-developed.  HENT:  Right Ear: External ear normal.  Left Ear: External ear normal.  Dry mouth  Eyes: Pupils are equal, round, and reactive to light. Conjunctivae are normal.  Neck: Normal range of motion. Neck supple.  Cardiovascular: Normal rate, regular rhythm and normal heart sounds.   Pulmonary/Chest: Effort normal.  Abdominal: Soft. Bowel sounds are normal. There is no tenderness. There is no guarding.  Musculoskeletal: Normal range of motion.  Neurological: He is alert and oriented to person, place, and time.  Skin: Skin is warm and dry.  Nursing note and vitals reviewed.    UC Treatments / Results  Labs (all labs ordered are listed, but only abnormal results are displayed) Labs Reviewed - No data to display  EKG  EKG Interpretation None       Radiology No results found.  Procedures Procedures (including critical care time)  Medications Ordered in UC Medications  0.9 %  sodium chloride infusion ( Intravenous New Bag/Given 03/09/17 1354)  ondansetron (ZOFRAN) injection 4 mg (4 mg Intravenous Given 03/09/17 1353)     Initial Impression / Assessment and Plan / UC Course  I have reviewed the triage vital signs and the nursing notes.  Pertinent labs & imaging results that were available during my care of the patient were reviewed by me and considered in my medical decision making (see chart for details).     Final Clinical Impressions(s) / UC Diagnoses   Final diagnoses:  Non-intractable vomiting with nausea, unspecified vomiting type    New Prescriptions Discharge Medication List as of 03/09/2017  2:23 PM     Zofran Rx  provided  Controlled Substance Prescriptions [place] Controlled Substance Registry consulted? Not Applicable   Elvina Sidle, MD 03/09/17 1546

## 2017-03-09 NOTE — Discharge Instructions (Addendum)
You really need to follow-up with a gastroenterologist to keep this from happening over and over again. Chronic vomiting leads to tears in the esophagus which can cause scarring and strictures later on in life, making swallowing difficult.  Take the Nexium daily once your vomiting is under control. If the vomiting persists, you'll need to be seen in the emergency department

## 2017-03-09 NOTE — ED Triage Notes (Signed)
Pt reports vomiting since 0400 this morning.  He reports a three year history of stomach ulcers.  Pt has not been to a GI specialist yet.  He does not have a PCP.

## 2017-06-22 ENCOUNTER — Emergency Department (HOSPITAL_COMMUNITY)
Admission: EM | Admit: 2017-06-22 | Discharge: 2017-06-23 | Disposition: A | Discharge status: Home / Self Care | Payer: Self-pay | Attending: Emergency Medicine | Admitting: Emergency Medicine

## 2017-06-22 ENCOUNTER — Encounter (HOSPITAL_COMMUNITY): Payer: Self-pay

## 2017-06-22 DIAGNOSIS — Z79899 Other long term (current) drug therapy: Secondary | ICD-10-CM | POA: Insufficient documentation

## 2017-06-22 DIAGNOSIS — R1033 Periumbilical pain: Secondary | ICD-10-CM | POA: Insufficient documentation

## 2017-06-22 DIAGNOSIS — R112 Nausea with vomiting, unspecified: Secondary | ICD-10-CM | POA: Insufficient documentation

## 2017-06-22 LAB — COMPREHENSIVE METABOLIC PANEL
ALBUMIN: 4.6 g/dL (ref 3.5–5.0)
ALK PHOS: 76 U/L (ref 38–126)
ALT: 15 U/L — AB (ref 17–63)
AST: 24 U/L (ref 15–41)
Anion gap: 17 — ABNORMAL HIGH (ref 5–15)
BILIRUBIN TOTAL: 1.2 mg/dL (ref 0.3–1.2)
BUN: 19 mg/dL (ref 6–20)
CO2: 23 mmol/L (ref 22–32)
CREATININE: 1.07 mg/dL (ref 0.61–1.24)
Calcium: 10.1 mg/dL (ref 8.9–10.3)
Chloride: 99 mmol/L — ABNORMAL LOW (ref 101–111)
GFR calc Af Amer: >60 mL/min (ref >60)
GFR calc non Af Amer: >60 mL/min (ref >60)
GLUCOSE: 115 mg/dL — AB (ref 65–99)
POTASSIUM: 3.4 mmol/L — AB (ref 3.5–5.1)
Sodium: 139 mmol/L (ref 135–145)
TOTAL PROTEIN: 8.3 g/dL — AB (ref 6.5–8.1)

## 2017-06-22 LAB — CBC
HEMATOCRIT: 45.6 % (ref 39.0–52.0)
Hemoglobin: 15.8 g/dL (ref 13.0–17.0)
MCH: 29.2 pg (ref 26.0–34.0)
MCHC: 34.6 g/dL (ref 30.0–36.0)
MCV: 84.3 fL (ref 78.0–100.0)
Platelets: 264 10*3/uL (ref 150–400)
RBC: 5.41 MIL/uL (ref 4.22–5.81)
RDW: 13 % (ref 11.5–15.5)
WBC: 13.2 10*3/uL — ABNORMAL HIGH (ref 4.0–10.5)

## 2017-06-22 LAB — URINALYSIS, ROUTINE W REFLEX MICROSCOPIC
Bilirubin Urine: NEGATIVE
Glucose, UA: NEGATIVE mg/dL
Hgb urine dipstick: NEGATIVE
Ketones, ur: 80 mg/dL — AB
Leukocytes, UA: NEGATIVE
NITRITE: NEGATIVE
Protein, ur: 100 mg/dL — AB
SPECIFIC GRAVITY, URINE: 1.034 — AB (ref 1.005–1.030)
SQUAMOUS EPITHELIAL / LPF: NONE SEEN
pH: 5 (ref 5.0–8.0)

## 2017-06-22 LAB — LIPASE, BLOOD: Lipase: 36 U/L (ref 11–51)

## 2017-06-22 MED ORDER — FAMOTIDINE IN NACL 20-0.9 MG/50ML-% IV SOLN
20.0000 mg | Freq: Once | INTRAVENOUS | Status: AC
  Administered 2017-06-22: 20 mg via INTRAVENOUS
  Filled 2017-06-22: qty 50

## 2017-06-22 MED ORDER — SODIUM CHLORIDE 0.9 % IV BOLUS (SEPSIS)
1000.0000 mL | Freq: Once | INTRAVENOUS | Status: AC
  Administered 2017-06-22: 1000 mL via INTRAVENOUS

## 2017-06-22 MED ORDER — ONDANSETRON HCL 4 MG/2ML IJ SOLN
4.0000 mg | Freq: Once | INTRAMUSCULAR | Status: AC
  Administered 2017-06-22: 4 mg via INTRAVENOUS
  Filled 2017-06-22: qty 2

## 2017-06-22 MED ORDER — ONDANSETRON 4 MG PO TBDP
4.0000 mg | ORAL_TABLET | Freq: Once | ORAL | Status: AC | PRN
  Administered 2017-06-22: 4 mg via ORAL
  Filled 2017-06-22: qty 1

## 2017-06-22 MED ORDER — GI COCKTAIL ~~LOC~~
30.0000 mL | Freq: Once | ORAL | Status: AC
  Administered 2017-06-22: 30 mL via ORAL
  Filled 2017-06-22: qty 30

## 2017-06-22 MED ORDER — PANTOPRAZOLE SODIUM 40 MG IV SOLR
40.0000 mg | Freq: Once | INTRAVENOUS | Status: AC
  Administered 2017-06-22: 40 mg via INTRAVENOUS
  Filled 2017-06-22: qty 40

## 2017-06-22 NOTE — ED Provider Notes (Signed)
[facility] EMERGENCY DEPARTMENT Provider Note   CSN: [id number] Arrival date & time: 06/22/17  1504     History   Chief Complaint Chief Complaint  Patient presents with  . Abdominal Pain    HPI Steven Burch is a 22 y.o. male.  HPI   Patient is a 22 year old male with a reported history of stomach ulcers (no endoscopic diagnosis) and no abdominal surgical history presenting for epigastric pain and intractable vomiting.  Patient reports that this has happened multiple times in the past, and the current episode feels exactly the same as prior episodes of nausea, vomiting, and epigastric pain he has experienced over the last 5 years.  Patient reports he was told that he likely has gastric ulcers in the emergency department, however due to insurance issues, patient has been unable to follow-up with either primary care or gastroenterology.  Patient has never had an EGD.  Patient reports that he has not been able to keep down fluids or solids for the past 48 hours.  Patient denies bilious vomiting or hematemesis.  Last bowel movement 2 days ago and normal for patient.  No melena or hematochezia.  No diarrhea.  No fever or chills.  The epigastric pain is sharp in quality and typically just before vomiting, or when the abdomen is compressed.  Patient has previously tried over-the-counter omeprazole for symptoms.  Patient denies any use of NSAIDs.  Patient reports he does not drink any alcohol.  Patient denies any illicit drug use.  Past Medical History:  Diagnosis Date  . Multiple gastric ulcers     There are no active problems to display for this patient.   Past Surgical History:  Procedure Laterality Date  . ORTHOPEDIC SURGERY         Home Medications    Prior to Admission medications   Medication Sig Start Date End Date Taking? Authorizing Provider  alum & mag hydroxide-simeth (MYLANTA) 200-200-20 MG/5ML suspension Take 15-30 mLs by mouth every 6 (six)  hours as needed for indigestion or heartburn.    [provider]  methocarbamol (ROBAXIN) 500 MG tablet Take 2 tablets (1,000 mg total) by mouth 4 (four) times daily. 05/15/16   Renne Crigler, PA-C  omeprazole (PRILOSEC) 20 MG capsule Take 1 capsule (20 mg total) by mouth daily. 03/13/16   Nelva Nay, MD  ondansetron (ZOFRAN ODT) 4 MG disintegrating tablet Take 1 tablet (4 mg total) by mouth every 8 (eight) hours as needed for nausea. 03/09/17   Elvina Sidle, MD    Family History Family History  Problem Relation Age of Onset  . Heart disease Other     Social History Social History   Tobacco Use  . Smoking status: Never Smoker  . Smokeless tobacco: Never Used  Substance Use Topics  . Alcohol use: No  . Drug use: Yes    Frequency: 0.5 times per week    Types: Marijuana    Comment: last use 1 month ago     Allergies   Nsaids   Review of Systems Review of Systems  Constitutional: Negative for chills and fever.  HENT: Negative for congestion and rhinorrhea.   Respiratory: Negative for cough.   Cardiovascular: Negative for chest pain.  Gastrointestinal: Positive for abdominal pain, nausea and vomiting. Negative for anal bleeding, blood in stool, diarrhea and rectal pain.  Genitourinary: Negative for difficulty urinating, flank pain, penile pain, scrotal swelling and testicular pain.  All other systems reviewed and are negative.  Physical Exam Updated Vital Signs BP 121/66 (BP Location: Right Arm)   Pulse 61   Temp (!) 97.4 F (36.3 C) (Oral)   Resp 18   Ht 5\' 8"  (1.727 m)   Wt 72.6 kg (160 lb)   SpO2 100%   BMI 24.33 kg/m   Physical Exam  Constitutional: He appears well-developed and well-nourished. No distress.  HENT:  Head: Normocephalic and atraumatic.  Mouth/Throat: Oropharynx is clear and moist.  Eyes: Conjunctivae and EOM are normal. Pupils are equal, round, and reactive to light.  Neck: Normal range of motion. Neck supple.    Cardiovascular: Normal rate, regular rhythm, S1 normal and S2 normal.  No murmur heard. Pulmonary/Chest: Effort normal and breath sounds normal. He has no wheezes. He has no rales.  Abdominal: Soft. He exhibits no distension. There is tenderness. There is no guarding.  Abdomen without surgical scars.  Epigastric tenderness.  No rebound.  There is no other focal abdominal tenderness.  Musculoskeletal: Normal range of motion. He exhibits no edema or deformity.  Lymphadenopathy:    He has no cervical adenopathy.  Neurological: He is alert.  Cranial nerves grossly intact. Patient was extremities symmetrically and with good coordination.  Skin: Skin is warm and dry. No rash noted. No erythema.  Psychiatric: He has a normal mood and affect. His behavior is normal. Judgment and thought content normal.  Nursing note and vitals reviewed.    ED Treatments / Results  Labs (all labs ordered are listed, but only abnormal results are displayed) Labs Reviewed  COMPREHENSIVE METABOLIC PANEL - Abnormal; Notable for the following components:      Result Value   Potassium 3.4 (*)    Chloride 99 (*)    Glucose, Bld 115 (*)    Total Protein 8.3 (*)    ALT 15 (*)    Anion gap 17 (*)    All other components within normal limits  CBC - Abnormal; Notable for the following components:   WBC 13.2 (*)    All other components within normal limits  URINALYSIS, ROUTINE W REFLEX MICROSCOPIC - Abnormal; Notable for the following components:   Color, Urine AMBER (*)    APPearance HAZY (*)    Specific Gravity, Urine 1.034 (*)    Ketones, ur 80 (*)    Protein, ur 100 (*)    Bacteria, UA RARE (*)    All other components within normal limits  LIPASE, BLOOD    EKG  EKG Interpretation None       Radiology No results found.  Procedures Procedures (including critical care time)  Medications Ordered in ED Medications  sodium chloride 0.9 % bolus 1,000 mL (not administered)  ondansetron (ZOFRAN)  injection 4 mg (not administered)  gi cocktail (Maalox,Lidocaine,Donnatal) (not administered)  pantoprazole (PROTONIX) injection 40 mg (not administered)  famotidine (PEPCID) IVPB 20 mg premix (not administered)  ondansetron (ZOFRAN-ODT) disintegrating tablet 4 mg (4 mg Oral Given 06/22/17 1512)     Initial Impression / Assessment and Plan / ED Course  I have reviewed the triage vital signs and the nursing notes.  Pertinent labs & imaging results that were available during my care of the patient were reviewed by me and considered in my medical decision making (see chart for details).  Clinical Course as of Jun 23 133  Fri Jun 23, 2017  0032 Patient reevaluated.  Patient improved with both abdominal pain and nausea.  Will attempt p.o. challenge.  [AM]    Clinical Course User Index [AM]  Elisha Ponder, PA-C    Final Clinical Impressions(s) / ED Diagnoses   Final diagnoses:  Non-intractable vomiting with nausea, unspecified vomiting type  Periumbilical pain   Patient is nontoxic-appearing, non-tachycardic, afebrile, and in no acute distress on examination.  Pain is only reproducible on palpation.  Abdomen is benign and nonsurgical.  Differential diagnosis includes gastritis, peptic ulcer disease, viral gastroenteritis.  Lipase is normal, therefore doubt pancreatitis.  Other laboratory analysis significant for WBC count of 13.2.  I suspect that this is likely reactive.  11:45 PM Will initiate normal saline repletion, Protonix, Zofran, Pepcid, and GI cocktail and reassess.  1:35 AM On multiple re-evaluations, patient was tolerating p.o. liquids.  Nausea and periumbilical abdominal pain are improved.  Patient feels ready for discharge on antiemetics, proton pump inhibitors, H2 blockers, and Carafate.  I have low suspicion for focal organ pathologies at this time.  There is no evidence of GI bleeding.  Will treat patient empirically for gastritis.  Patient was given return precautions for  any fever, chills with symptoms, intractable nausea or vomiting, or focal abdominal pain.  Patient is actively trying to get into primary care as well as gastroenterology.  ED Discharge Orders    None       Delia Chimes 06/23/17 0140    Zadie Rhine, MD 06/23/17 [phone]

## 2017-06-22 NOTE — ED Triage Notes (Signed)
Per Pt, Pt has hx of stomach ulcers. Reports that he has had about 2 days of vomiting and he is unable to keep anything down. Reports no diarrhea.

## 2017-06-23 MED ORDER — SUCRALFATE 1 G PO TABS: 1.0000 g | ORAL_TABLET | Freq: Three times a day (TID) | ORAL | 0 refills | Status: DC

## 2017-06-23 MED ORDER — ONDANSETRON 4 MG PO TBDP: 4.0000 mg | ORAL_TABLET | Freq: Three times a day (TID) | ORAL | 0 refills | Status: DC | PRN

## 2017-06-23 MED ORDER — FAMOTIDINE 20 MG PO TABS: 20.0000 mg | ORAL_TABLET | Freq: Two times a day (BID) | ORAL | 0 refills | Status: DC

## 2017-06-23 MED ORDER — PANTOPRAZOLE SODIUM 20 MG PO TBEC: 20.0000 mg | DELAYED_RELEASE_TABLET | Freq: Every day | ORAL | 0 refills | Status: DC

## 2017-06-23 NOTE — ED Notes (Signed)
Unable to obtain signature from pt d/t malfunction in signature pad. Reviewed all d/c instructions and prescriptions with the pt, who voiced understanding and denied additional questions or concerns.   Pt departed in NAD, refused use of wheelchair.

## 2017-06-23 NOTE — Discharge Instructions (Signed)
Please see the information and instructions below regarding your visit.  Your diagnoses today include:  1. Non-intractable vomiting with nausea, unspecified vomiting type   2. Periumbilical pain     Your exam and testing today is reassuring that there is not a condition causing your abdominal pain that we immediately need to intervene on at this time.   Abdominal (belly) pain can be caused by many things. Your caregiver performed an examination and possibly ordered blood/urine tests and imaging (CT scan, x-rays, ultrasound). Many cases can be observed and treated at home after initial evaluation in the emergency department. Even though you are being discharged home, abdominal pain can be unpredictable. Therefore, you need a repeated exam if your pain does not resolve, returns, or worsens. Most patients with abdominal pain don't have to be admitted to the hospital or have surgery, but serious problems like appendicitis and gallbladder attacks can start out as nonspecific pain. Many abdominal conditions cannot be diagnosed in one visit, so follow-up evaluations are very important.  Tests performed today include: Blood counts and electrolytes Blood tests to check liver and kidney function Blood tests to check pancreas function Vital signs. See below for your results today.   See side panel of your discharge paperwork for testing performed today. Vital signs are listed at the bottom of these instructions.   Medications prescribed:    Take any prescribed medications only as prescribed, and any over the counter medications only as directed on the packaging.  Protonix.  This is a medicine to reduce acid in your stomach.  Pepcid.  This is another medicine to reduce acid in your stomach.  Carafate.  This is a medicine to coat the lining of the stomach.  Zofran.  This is for nausea.  You may take 1 oral disintegrating tablet every 6-8 hours as needed for nausea.  Home care instructions:  Try  eating, but start with foods that have a lot of fluid in them. Good examples are soup, Jell-O, and popsicles. If you do OK with those foods, you can try soft, bland foods, such as plain yogurt. Foods that are high in carbohydrates ("carbs"), like bread or saltine crackers, can help settle your stomach. Some people also find that ginger helps with nausea. You should avoid foods that have a lot of fat in them. They can make nausea worse. Call your doctor if your symptoms come back when you try to eat.  Please follow any educational materials contained in this packet.   Follow-up instructions: Please follow-up with your primary care provider as soon as possible for further evaluation of your symptoms if they are not completely improved.   Return instructions:  Please return to the Emergency Department if you experience worsening symptoms.  SEEK IMMEDIATE MEDICAL ATTENTION IF: The pain does not go away or becomes severe  A temperature above 101F develops  Repeated vomiting occurs (multiple episodes)  The pain becomes localized to portions of the abdomen. The right side could possibly be appendicitis. In an adult, the left lower portion of the abdomen could be colitis or diverticulitis.  Blood is being passed in stools or vomit (bright red or black tarry stools)  You develop chest pain, difficulty breathing, dizziness or fainting, or become confused, poorly responsive, or inconsolable (young children) If you have any other emergent concerns regarding your health  Additional Information:   Your vital signs today were: BP 112/61    Pulse (!) 48    Temp (!) 97.4 F (36.3 C) (  Oral)    Resp 14    Ht 5\' 8"  (1.727 m)    Wt 72.6 kg (160 lb)    SpO2 99%    BMI 24.33 kg/m  If your blood pressure (BP) was elevated on multiple readings during this visit above 130 for the top number or above 80 for the bottom number, please have this repeated by your primary care provider within one  month. --------------  Thank you for allowing us to participate in your care today.

## 2017-06-24 ENCOUNTER — Emergency Department (HOSPITAL_COMMUNITY): Payer: Self-pay

## 2017-06-24 ENCOUNTER — Other Ambulatory Visit: Payer: Self-pay

## 2017-06-24 ENCOUNTER — Encounter (HOSPITAL_COMMUNITY): Payer: Self-pay | Admitting: Emergency Medicine

## 2017-06-24 ENCOUNTER — Emergency Department (HOSPITAL_COMMUNITY)
Admission: EM | Admit: 2017-06-24 | Discharge: 2017-06-24 | Disposition: A | Discharge status: Home / Self Care | Payer: Self-pay | Attending: Emergency Medicine | Admitting: Emergency Medicine

## 2017-06-24 DIAGNOSIS — R1115 Cyclical vomiting syndrome unrelated to migraine: Secondary | ICD-10-CM

## 2017-06-24 DIAGNOSIS — K2950 Unspecified chronic gastritis without bleeding: Secondary | ICD-10-CM | POA: Insufficient documentation

## 2017-06-24 DIAGNOSIS — Z79899 Other long term (current) drug therapy: Secondary | ICD-10-CM | POA: Insufficient documentation

## 2017-06-24 DIAGNOSIS — R112 Nausea with vomiting, unspecified: Secondary | ICD-10-CM | POA: Insufficient documentation

## 2017-06-24 LAB — URINALYSIS, ROUTINE W REFLEX MICROSCOPIC
Bilirubin Urine: NEGATIVE
Glucose, UA: NEGATIVE mg/dL
Ketones, ur: 80 mg/dL — AB
Leukocytes, UA: NEGATIVE
Nitrite: NEGATIVE
PROTEIN: 30 mg/dL — AB
SPECIFIC GRAVITY, URINE: 1.032 — AB (ref 1.005–1.030)
pH: 5 (ref 5.0–8.0)

## 2017-06-24 LAB — COMPREHENSIVE METABOLIC PANEL
ALT: 19 U/L (ref 17–63)
ANION GAP: 17 — AB (ref 5–15)
AST: 24 U/L (ref 15–41)
Albumin: 4.6 g/dL (ref 3.5–5.0)
Alkaline Phosphatase: 67 U/L (ref 38–126)
BUN: 23 mg/dL — ABNORMAL HIGH (ref 6–20)
CHLORIDE: 99 mmol/L — AB (ref 101–111)
CO2: 22 mmol/L (ref 22–32)
CREATININE: 1.08 mg/dL (ref 0.61–1.24)
Calcium: 9.9 mg/dL (ref 8.9–10.3)
Glucose, Bld: 100 mg/dL — ABNORMAL HIGH (ref 65–99)
Potassium: 3.3 mmol/L — ABNORMAL LOW (ref 3.5–5.1)
Sodium: 138 mmol/L (ref 135–145)
Total Bilirubin: 1.4 mg/dL — ABNORMAL HIGH (ref 0.3–1.2)
Total Protein: 7.8 g/dL (ref 6.5–8.1)

## 2017-06-24 LAB — LIPASE, BLOOD: LIPASE: 25 U/L (ref 11–51)

## 2017-06-24 LAB — CK: Total CK: 110 U/L (ref 49–397)

## 2017-06-24 LAB — CBC
HCT: 44.8 % (ref 39.0–52.0)
Hemoglobin: 15.7 g/dL (ref 13.0–17.0)
MCH: 29.7 pg (ref 26.0–34.0)
MCHC: 35 g/dL (ref 30.0–36.0)
MCV: 84.8 fL (ref 78.0–100.0)
PLATELETS: 257 10*3/uL (ref 150–400)
RBC: 5.28 MIL/uL (ref 4.22–5.81)
RDW: 12.8 % (ref 11.5–15.5)
WBC: 12.9 10*3/uL — AB (ref 4.0–10.5)

## 2017-06-24 LAB — I-STAT CG4 LACTIC ACID, ED
Lactic Acid, Venous: 1.61 mmol/L (ref 0.5–1.9)
Lactic Acid, Venous: 0.6 mmol/L (ref 0.5–1.9)

## 2017-06-24 LAB — POC OCCULT BLOOD, ED: Fecal Occult Bld: NEGATIVE

## 2017-06-24 MED ORDER — ONDANSETRON HCL 4 MG/2ML IJ SOLN: 4.0000 mg | Freq: Once | INTRAMUSCULAR | Status: DC

## 2017-06-24 MED ORDER — IOPAMIDOL (ISOVUE-300) INJECTION 61%
INTRAVENOUS | Status: AC
  Administered 2017-06-24: 100 mL
  Filled 2017-06-24: qty 100

## 2017-06-24 MED ORDER — SODIUM CHLORIDE 0.9 % IV BOLUS (SEPSIS)
2000.0000 mL | Freq: Once | INTRAVENOUS | Status: AC
  Administered 2017-06-24: 2000 mL via INTRAVENOUS

## 2017-06-24 MED ORDER — SODIUM CHLORIDE 0.9 % IV BOLUS (SEPSIS): 1000.0000 mL | Freq: Once | INTRAVENOUS | Status: DC

## 2017-06-24 MED ORDER — PROMETHAZINE HCL 25 MG PO TABS: 25.0000 mg | ORAL_TABLET | Freq: Three times a day (TID) | ORAL | 0 refills | Status: DC | PRN

## 2017-06-24 MED ORDER — SODIUM CHLORIDE 0.9 % IV BOLUS (SEPSIS)
1000.0000 mL | Freq: Once | INTRAVENOUS | Status: AC
  Administered 2017-06-24: 1000 mL via INTRAVENOUS

## 2017-06-24 MED ORDER — PROMETHAZINE HCL 25 MG/ML IJ SOLN
25.0000 mg | Freq: Once | INTRAMUSCULAR | Status: AC
  Administered 2017-06-24: 25 mg via INTRAVENOUS
  Filled 2017-06-24: qty 1

## 2017-06-24 NOTE — Discharge Instructions (Signed)
You will need to follow-up with the GI doctor as soon as possible.  Return here as needed.  Your testing did not show any significant abnormalities.

## 2017-06-24 NOTE — ED Provider Notes (Signed)
[facility] EMERGENCY DEPARTMENT Provider Note   CSN: [id number] Arrival date & time: 06/24/17  0809     History   Chief Complaint Chief Complaint  Patient presents with  . Emesis  . Nausea  . Abdominal Pain    HPI Steven Burch is a 22 y.o. male.  HPI Patient presents to the emergency department with chronic abdominal pain that has been recurrent over many years.  Patient states that he was here several days ago with similar complaints.  The patient states that he was given antiemetics and other medications but is unable to afford them.  Patient states that he is not able to see a GI doctor.  The patient denies chest pain, shortness of breath, headache,blurred vision, neck pain, fever, cough, weakness, numbness, dizziness, anorexia, edema, diarrhea, rash, back pain, dysuria, hematemesis, bloody stool, near syncope, or syncope. Past Medical History:  Diagnosis Date  . Multiple gastric ulcers     There are no active problems to display for this patient.   Past Surgical History:  Procedure Laterality Date  . ORTHOPEDIC SURGERY         Home Medications    Prior to Admission medications   Medication Sig Start Date End Date Taking? Authorizing Provider  cetirizine (ZYRTEC) 10 MG tablet Take 10 mg by mouth daily as needed for allergies.   Yes [provider]  omeprazole (PRILOSEC) 20 MG capsule Take 1 capsule (20 mg total) by mouth daily. 03/13/16  Yes Nelva Nay, MD  ondansetron (ZOFRAN-ODT) 4 MG disintegrating tablet Take 1 tablet (4 mg total) by mouth every 8 (eight) hours as needed for nausea or vomiting. 06/23/17  Yes [place] Scrape, Alyssa B, PA-C  famotidine (PEPCID) 20 MG tablet Take 1 tablet (20 mg total) by mouth 2 (two) times daily. 06/23/17   Aviva Kluver B, PA-C  methocarbamol (ROBAXIN) 500 MG tablet Take 2 tablets (1,000 mg total) by mouth 4 (four) times daily. Patient not taking: Reported on 06/22/2017 05/15/16   Renne Crigler, PA-C    pantoprazole (PROTONIX) 20 MG tablet Take 1 tablet (20 mg total) by mouth daily for 14 days. 06/23/17 07/07/17  Aviva Kluver B, PA-C  sucralfate (CARAFATE) 1 g tablet Take 1 tablet (1 g total) by mouth 4 (four) times daily -  with meals and at bedtime for 7 days. 06/23/17 06/30/17  Elisha Ponder, PA-C    Family History Family History  Problem Relation Age of Onset  . Heart disease Other     Social History Social History   Tobacco Use  . Smoking status: Never Smoker  . Smokeless tobacco: Never Used  Substance Use Topics  . Alcohol use: No  . Drug use: Yes    Frequency: 0.5 times per week    Types: Marijuana    Comment: last use 1 month ago     Allergies   Nsaids   Review of Systems Review of Systems All other systems negative except as documented in the HPI. All pertinent positives and negatives as reviewed in the HPI.  Physical Exam Updated Vital Signs BP 116/68 (BP Location: Left Arm)   Pulse 63   Temp 97.9 F (36.6 C) (Oral)   Resp 18   Ht 5\' 8"  (1.727 m)   Wt 72.6 kg (160 lb)   SpO2 100%   BMI 24.33 kg/m   Physical Exam  Constitutional: He is oriented to person, place, and time. He appears well-developed and well-nourished. No distress.  HENT:  Head: Normocephalic  and atraumatic.  Mouth/Throat: Oropharynx is clear and moist.  Eyes: Pupils are equal, round, and reactive to light.  Neck: Normal range of motion. Neck supple.  Cardiovascular: Normal rate, regular rhythm and normal heart sounds. Exam reveals no gallop and no friction rub.  No murmur heard. Pulmonary/Chest: Effort normal and breath sounds normal. No respiratory distress. He has no wheezes.  Abdominal: Soft. Bowel sounds are normal. He exhibits no distension. There is generalized tenderness. No hernia.  Neurological: He is alert and oriented to person, place, and time. He exhibits normal muscle tone. Coordination normal.  Skin: Skin is warm and dry. Capillary refill takes less than 2 seconds.  No rash noted. No erythema.  Psychiatric: He has a normal mood and affect. His behavior is normal.  Nursing note and vitals reviewed.    ED Treatments / Results  Labs (all labs ordered are listed, but only abnormal results are displayed) Labs Reviewed  COMPREHENSIVE METABOLIC PANEL - Abnormal; Notable for the following components:      Result Value   Potassium 3.3 (*)    Chloride 99 (*)    Glucose, Bld 100 (*)    BUN 23 (*)    Total Bilirubin 1.4 (*)    Anion gap 17 (*)    All other components within normal limits  CBC - Abnormal; Notable for the following components:   WBC 12.9 (*)    All other components within normal limits  URINALYSIS, ROUTINE W REFLEX MICROSCOPIC - Abnormal; Notable for the following components:   Specific Gravity, Urine 1.032 (*)    Hgb urine dipstick SMALL (*)    Ketones, ur 80 (*)    Protein, ur 30 (*)    Bacteria, UA RARE (*)    Squamous Epithelial / LPF 0-5 (*)    All other components within normal limits  LIPASE, BLOOD  CK  POC OCCULT BLOOD, ED  I-STAT CG4 LACTIC ACID, ED  I-STAT CG4 LACTIC ACID, ED    EKG  EKG Interpretation None       Radiology Ct Abdomen Pelvis W Contrast  Result Date: 06/24/2017 CLINICAL DATA:  Abdominal pain, fever, nausea, vomiting EXAM: CT ABDOMEN AND PELVIS WITH CONTRAST TECHNIQUE: Multidetector CT imaging of the abdomen and pelvis was performed using the standard protocol following bolus administration of intravenous contrast. CONTRAST:  ISOVUE-300 IOPAMIDOL (ISOVUE-300) INJECTION 61% COMPARISON:  Ultrasound 10/24/2015 FINDINGS: Lower chest: Lung bases are clear. No effusions. Heart is normal size. Hepatobiliary: No focal hepatic abnormality. Gallbladder unremarkable. Pancreas: No focal abnormality or ductal dilatation. Spleen: No focal abnormality.  Normal size. Adrenals/Urinary Tract: No adrenal abnormality. No focal renal abnormality. No stones or hydronephrosis. Urinary bladder is unremarkable.  Stomach/Bowel: Appendix is normal. Stomach, large and small bowel grossly unremarkable. Vascular/Lymphatic: No evidence of aneurysm or adenopathy. Reproductive: No visible focal abnormality. Other: No free fluid or free air. Musculoskeletal: No bony abnormality. IMPRESSION: No acute findings in the abdomen or pelvis.  PICC Electronically Signed   By: Charlett Nose M.D.   On: 06/24/2017 12:39    Procedures Procedures (including critical care time)  Medications Ordered in ED Medications  promethazine (PHENERGAN) injection 25 mg (25 mg Intravenous Given 06/24/17 0951)  sodium chloride 0.9 % bolus 2,000 mL (0 mLs Intravenous Stopped 06/24/17 1050)  iopamidol (ISOVUE-300) 61 % injection (100 mLs  Contrast Given 06/24/17 1209)  sodium chloride 0.9 % bolus 1,000 mL (1,000 mLs Intravenous New Bag/Given 06/24/17 1332)     Initial Impression / Assessment and Plan /  ED Course  I have reviewed the triage vital signs and the nursing notes.  Pertinent labs & imaging results that were available during my care of the patient were reviewed by me and considered in my medical decision making (see chart for details).     Patient is never had a formal GI workup and I feel that this is what is needed.  The patient is advised that this will need to be the next steps.  I advised him that at this time his laboratory testing does not show any significant changes and we did hydrate him very well with 3 L of fluid I did send him home with discount coupons for his prescriptions along with Phenergan.  Patient is advised to return for any worsening in his condition the patient agrees the plan and all questions were answered the CT scan of his abdomen did not show any significant abnormality and I did review the images. Final Clinical Impressions(s) / ED Diagnoses   Final diagnoses:  None    ED Discharge Orders    None       Charlestine NightLawyer, Paizley Ramella, PA-C 06/25/17 1528    Doug SouJacubowitz, Sam, MD 06/25/17 1539

## 2017-06-24 NOTE — ED Triage Notes (Signed)
Pt. Stated, IVE HAD n/v FOR 4 DAYS  And it is still there.. I was here 2 days ago for the same.

## 2017-06-24 NOTE — ED Notes (Signed)
Patient transported to CT 

## 2018-01-05 ENCOUNTER — Emergency Department (HOSPITAL_COMMUNITY)
Admission: EM | Admit: 2018-01-05 | Discharge: 2018-01-05 | Disposition: A | Discharge status: Home / Self Care | Payer: Medicaid Other | Attending: Emergency Medicine | Admitting: Emergency Medicine

## 2018-01-05 ENCOUNTER — Encounter (HOSPITAL_COMMUNITY): Payer: Self-pay | Admitting: Emergency Medicine

## 2018-01-05 ENCOUNTER — Other Ambulatory Visit: Payer: Self-pay

## 2018-01-05 DIAGNOSIS — F12188 Cannabis abuse with other cannabis-induced disorder: Secondary | ICD-10-CM | POA: Insufficient documentation

## 2018-01-05 DIAGNOSIS — R1110 Vomiting, unspecified: Secondary | ICD-10-CM | POA: Insufficient documentation

## 2018-01-05 DIAGNOSIS — R1013 Epigastric pain: Secondary | ICD-10-CM

## 2018-01-05 DIAGNOSIS — F12988 Cannabis use, unspecified with other cannabis-induced disorder: Secondary | ICD-10-CM

## 2018-01-05 DIAGNOSIS — F1290 Cannabis use, unspecified, uncomplicated: Secondary | ICD-10-CM

## 2018-01-05 DIAGNOSIS — Z79899 Other long term (current) drug therapy: Secondary | ICD-10-CM | POA: Insufficient documentation

## 2018-01-05 LAB — CBC
HEMATOCRIT: 48.1 % (ref 39.0–52.0)
HEMOGLOBIN: 15.8 g/dL (ref 13.0–17.0)
MCH: 28.5 pg (ref 26.0–34.0)
MCHC: 32.8 g/dL (ref 30.0–36.0)
MCV: 86.8 fL (ref 78.0–100.0)
Platelets: 240 10*3/uL (ref 150–400)
RBC: 5.54 MIL/uL (ref 4.22–5.81)
RDW: 13.1 % (ref 11.5–15.5)
WBC: 12.2 10*3/uL — ABNORMAL HIGH (ref 4.0–10.5)

## 2018-01-05 LAB — RAPID URINE DRUG SCREEN, HOSP PERFORMED
Amphetamines: NOT DETECTED
BENZODIAZEPINES: POSITIVE — AB
Barbiturates: NOT DETECTED
Cocaine: NOT DETECTED
Opiates: NOT DETECTED
TETRAHYDROCANNABINOL: POSITIVE — AB

## 2018-01-05 LAB — URINALYSIS, ROUTINE W REFLEX MICROSCOPIC
BILIRUBIN URINE: NEGATIVE
GLUCOSE, UA: NEGATIVE mg/dL
Hgb urine dipstick: NEGATIVE
Ketones, ur: 20 mg/dL — AB
Leukocytes, UA: NEGATIVE
Nitrite: NEGATIVE
PH: 8 (ref 5.0–8.0)
Protein, ur: NEGATIVE mg/dL
SPECIFIC GRAVITY, URINE: 1.016 (ref 1.005–1.030)

## 2018-01-05 LAB — COMPREHENSIVE METABOLIC PANEL
ALBUMIN: 5 g/dL (ref 3.5–5.0)
ALT: 14 U/L (ref 0–44)
ANION GAP: 13 (ref 5–15)
AST: 21 U/L (ref 15–41)
Alkaline Phosphatase: 62 U/L (ref 38–126)
BUN: 11 mg/dL (ref 6–20)
CHLORIDE: 105 mmol/L (ref 98–111)
CO2: 21 mmol/L — AB (ref 22–32)
Calcium: 9.9 mg/dL (ref 8.9–10.3)
Creatinine, Ser: 0.97 mg/dL (ref 0.61–1.24)
GFR calc Af Amer: >60 mL/min (ref >60)
GFR calc non Af Amer: >60 mL/min (ref >60)
GLUCOSE: 137 mg/dL — AB (ref 70–99)
POTASSIUM: 3.8 mmol/L (ref 3.5–5.1)
SODIUM: 139 mmol/L (ref 135–145)
Total Bilirubin: 0.7 mg/dL (ref 0.3–1.2)
Total Protein: 7.8 g/dL (ref 6.5–8.1)

## 2018-01-05 LAB — LIPASE, BLOOD: LIPASE: 30 U/L (ref 11–51)

## 2018-01-05 MED ORDER — SODIUM CHLORIDE 0.9 % IV BOLUS
1000.0000 mL | Freq: Once | INTRAVENOUS | Status: AC
  Administered 2018-01-05: 1000 mL via INTRAVENOUS

## 2018-01-05 MED ORDER — ONDANSETRON HCL 4 MG/2ML IJ SOLN
4.0000 mg | Freq: Once | INTRAMUSCULAR | Status: AC
  Administered 2018-01-05: 4 mg via INTRAVENOUS
  Filled 2018-01-05: qty 2

## 2018-01-05 MED ORDER — ONDANSETRON 4 MG PO TBDP
8.0000 mg | ORAL_TABLET | Freq: Once | ORAL | Status: DC
  Filled 2018-01-05: qty 2

## 2018-01-05 MED ORDER — LORAZEPAM 2 MG/ML IJ SOLN
1.0000 mg | Freq: Once | INTRAMUSCULAR | Status: AC
  Administered 2018-01-05: 1 mg via INTRAVENOUS
  Filled 2018-01-05: qty 1

## 2018-01-05 MED ORDER — FAMOTIDINE IN NACL 20-0.9 MG/50ML-% IV SOLN
20.0000 mg | Freq: Once | INTRAVENOUS | Status: AC
  Administered 2018-01-05: 20 mg via INTRAVENOUS
  Filled 2018-01-05: qty 50

## 2018-01-05 MED ORDER — METOCLOPRAMIDE HCL 5 MG/ML IJ SOLN
10.0000 mg | Freq: Once | INTRAMUSCULAR | Status: AC
  Administered 2018-01-05: 10 mg via INTRAVENOUS
  Filled 2018-01-05: qty 2

## 2018-01-05 NOTE — ED Triage Notes (Signed)
Pt reports nausea/vomiting since 4 AM. Pt with hx of same. Denies recent fever, diarrhea. Pt with dry heaves in triage. VSS.

## 2018-01-05 NOTE — ED Provider Notes (Signed)
[facility] EMERGENCY DEPARTMENT Provider Note   CSN: [id number] Arrival date & time: 01/05/18  1330     History   Chief Complaint Chief Complaint  Patient presents with  . Emesis    HPI Steven Burch is a 22 y.o. male.  Patient c/o epigastric pain and nausea and vomiting in the past day. Emesis episodic, several episodes, yellowish, not bloody. Epigastric pain dull, moderate, non radiating, without specific exacerbating or alleviating factors. No fever or chills. Is having normal bms, no diarrhea or constipation. No chest pain or sob. No known bad food ingestion or ill contacts.   The history is provided by the patient.  Emesis   Associated symptoms include abdominal pain. Pertinent negatives include no fever and no headaches.    Past Medical History:  Diagnosis Date  . Multiple gastric ulcers     There are no active problems to display for this patient.   Past Surgical History:  Procedure Laterality Date  . ORTHOPEDIC SURGERY          Home Medications    Prior to Admission medications   Medication Sig Start Date End Date Taking? Authorizing Provider  cetirizine (ZYRTEC) 10 MG tablet Take 10 mg by mouth daily as needed for allergies.    [provider]  famotidine (PEPCID) 20 MG tablet Take 1 tablet (20 mg total) by mouth 2 (two) times daily. 06/23/17   Aviva Kluver B, PA-C  methocarbamol (ROBAXIN) 500 MG tablet Take 2 tablets (1,000 mg total) by mouth 4 (four) times daily. Patient not taking: Reported on 06/22/2017 05/15/16   Renne Crigler, PA-C  omeprazole (PRILOSEC) 20 MG capsule Take 1 capsule (20 mg total) by mouth daily. 03/13/16   Nelva Nay, MD  ondansetron (ZOFRAN-ODT) 4 MG disintegrating tablet Take 1 tablet (4 mg total) by mouth every 8 (eight) hours as needed for nausea or vomiting. 06/23/17   Aviva Kluver B, PA-C  pantoprazole (PROTONIX) 20 MG tablet Take 1 tablet (20 mg total) by mouth daily for 14 days. 06/23/17  07/07/17  Aviva Kluver B, PA-C  promethazine (PHENERGAN) 25 MG tablet Take 1 tablet (25 mg total) by mouth every 8 (eight) hours as needed for nausea or vomiting. 06/24/17   Lawyer, Cristal Deer, PA-C  sucralfate (CARAFATE) 1 g tablet Take 1 tablet (1 g total) by mouth 4 (four) times daily -  with meals and at bedtime for 7 days. 06/23/17 06/30/17  Elisha Ponder, PA-C    Family History Family History  Problem Relation Age of Onset  . Heart disease Other     Social History Social History   Tobacco Use  . Smoking status: Never Smoker  . Smokeless tobacco: Never Used  Substance Use Topics  . Alcohol use: No  . Drug use: Yes    Frequency: 0.5 times per week    Types: Marijuana    Comment: last use 1 month ago     Allergies   Nsaids   Review of Systems Review of Systems  Constitutional: Negative for fever.  HENT: Negative for sore throat.   Eyes: Negative for redness.  Respiratory: Negative for shortness of breath.   Cardiovascular: Negative for chest pain.  Gastrointestinal: Positive for abdominal pain, nausea and vomiting.  Endocrine: Negative for polyuria.  Genitourinary: Negative for dysuria and flank pain.  Musculoskeletal: Negative for back pain and neck pain.  Skin: Negative for rash.  Neurological: Negative for headaches.  Hematological: Does not bruise/bleed easily.  Psychiatric/Behavioral: Negative for confusion.  Physical Exam Updated Vital Signs BP 123/76   Pulse (!) 57   Temp 97.9 F (36.6 C) (Oral)   Resp 16   Ht 1.727 m (5\' 8" )   Wt 70.3 kg (155 lb)   SpO2 100%   BMI 23.57 kg/m   Physical Exam  Constitutional: He appears well-developed and well-nourished. No distress.  HENT:  Head: Atraumatic.  Mouth/Throat: Oropharynx is clear and moist.  Eyes: Conjunctivae are normal. No scleral icterus.  Neck: Neck supple. No tracheal deviation present.  Cardiovascular: Normal rate, regular rhythm, normal heart sounds and intact distal pulses. Exam  reveals no gallop and no friction rub.  No murmur heard. Pulmonary/Chest: Effort normal and breath sounds normal. No accessory muscle usage. No respiratory distress.  Abdominal: Soft. Bowel sounds are normal. He exhibits no distension and no mass. There is no tenderness. There is no rebound and no guarding. No hernia.  Genitourinary:  Genitourinary Comments: No cva tenderness  Musculoskeletal: He exhibits no edema.  Neurological: He is alert.  Speech normal. Steady gait.   Skin: Skin is warm and dry.  Psychiatric:  Very anxious appearing.   Nursing note and vitals reviewed.    ED Treatments / Results  Labs (all labs ordered are listed, but only abnormal results are displayed) Results for orders placed or performed during the hospital encounter of 01/05/18  Lipase, blood  Result Value Ref Range   Lipase 30 11 - 51 U/L  Comprehensive metabolic panel  Result Value Ref Range   Sodium 139 135 - 145 mmol/L   Potassium 3.8 3.5 - 5.1 mmol/L   Chloride 105 98 - 111 mmol/L   CO2 21 (L) 22 - 32 mmol/L   Glucose, Bld 137 (H) 70 - 99 mg/dL   BUN 11 6 - 20 mg/dL   Creatinine, Ser 9.600.97 0.61 - 1.24 mg/dL   Calcium 9.9 8.9 - 45.410.3 mg/dL   Total Protein 7.8 6.5 - 8.1 g/dL   Albumin 5.0 3.5 - 5.0 g/dL   AST 21 15 - 41 U/L   ALT 14 0 - 44 U/L   Alkaline Phosphatase 62 38 - 126 U/L   Total Bilirubin 0.7 0.3 - 1.2 mg/dL   GFR calc non Af Amer >60 >60 mL/min   GFR calc Af Amer >60 >60 mL/min   Anion gap 13 5 - 15  CBC  Result Value Ref Range   WBC 12.2 (H) 4.0 - 10.5 K/uL   RBC 5.54 4.22 - 5.81 MIL/uL   Hemoglobin 15.8 13.0 - 17.0 g/dL   HCT 09.848.1 11.939.0 - 14.752.0 %   MCV 86.8 78.0 - 100.0 fL   MCH 28.5 26.0 - 34.0 pg   MCHC 32.8 30.0 - 36.0 g/dL   RDW 82.913.1 56.211.5 - 13.015.5 %   Platelets 240 150 - 400 K/uL  Urinalysis, Routine w reflex microscopic  Result Value Ref Range   Color, Urine YELLOW YELLOW   APPearance CLEAR CLEAR   Specific Gravity, Urine 1.016 1.005 - 1.030   pH 8.0 5.0 - 8.0    Glucose, UA NEGATIVE NEGATIVE mg/dL   Hgb urine dipstick NEGATIVE NEGATIVE   Bilirubin Urine NEGATIVE NEGATIVE   Ketones, ur 20 (A) NEGATIVE mg/dL   Protein, ur NEGATIVE NEGATIVE mg/dL   Nitrite NEGATIVE NEGATIVE   Leukocytes, UA NEGATIVE NEGATIVE  Rapid urine drug screen (hospital performed)  Result Value Ref Range   Opiates NONE DETECTED NONE DETECTED   Cocaine NONE DETECTED NONE DETECTED   Benzodiazepines POSITIVE (A) NONE  DETECTED   Amphetamines NONE DETECTED NONE DETECTED   Tetrahydrocannabinol POSITIVE (A) NONE DETECTED   Barbiturates NONE DETECTED NONE DETECTED   EKG None  Radiology No results found.  Procedures Procedures (including critical care time)  Medications Ordered in ED Medications  sodium chloride 0.9 % bolus 1,000 mL (1,000 mLs Intravenous New Bag/Given 01/05/18 1602)  famotidine (PEPCID) IVPB 20 mg premix (20 mg Intravenous New Bag/Given 01/05/18 1607)  sodium chloride 0.9 % bolus 1,000 mL (has no administration in time range)  ondansetron (ZOFRAN) injection 4 mg (4 mg Intravenous Given 01/05/18 1602)  LORazepam (ATIVAN) injection 1 mg (1 mg Intravenous Given 01/05/18 1604)     Initial Impression / Assessment and Plan / ED Course  I have reviewed the triage vital signs and the nursing notes.  Pertinent labs & imaging results that were available during my care of the patient were reviewed by me and considered in my medical decision making (see chart for details).  Iv ns bolus. zofran iv. pepcid iv.   Ativan 1 mg iv for anxiety.  Labs sent.  Labs reviewed - lytes normal.  Reviewed nursing notes and prior charts for additional history. 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   Recheck abd soft nt.   Tolerating po fluids.  Pt currently appears stable for d/c.     Final Clinical Impressions(s) / ED Diagnoses   Final diagnoses:    None    ED Discharge Orders    None       Cathren Laine, MD 01/05/18 1958

## 2018-01-05 NOTE — ED Provider Notes (Signed)
Patient placed in Quick Look pathway, seen and evaluated   Chief Complaint: Persistent vomiting  HPI: Patient with history of stomach ulcers presents the emergency department with epigastric pain and vomiting starting early this morning.  Patient took Zofran without relief.  Symptoms are similar to previous.  No fevers or diarrhea.  No blood in the vomit.  ROS:  Positive ROS: (+) Epigastric pain, vomiting Negative ROS: (-) Fever  Physical Exam:   Gen: Actively vomiting  Neuro: Awake and Alert  Skin: Warm    Focused Exam: Heart RRR, nml S1,S2, no m/r/g; Lungs CTAB; unable to perform abdominal exam as patient is actively vomiting.  BP (!) 144/80 (BP Location: Left Arm)   Pulse (!) 57   Temp 97.9 F (36.6 C) (Oral)   Resp 18   Ht 5\' 8"  (1.727 m)   Wt 70.3 kg (155 lb)   SpO2 100%   BMI 23.57 kg/m   Plan: Lab work-up initiated, ODT Zofran ordered.   Initiation of care has begun. The patient has been counseled on the process, plan, and necessity for staying for the completion/evaluation, and the remainder of the medical screening examination    Renne CriglerGeiple, Zavannah Deblois, Cordelia Poche-C 01/05/18 1412    Eber HongMiller, Brian, MD 01/06/18 2134

## 2018-01-05 NOTE — Discharge Instructions (Signed)
It was our pleasure to provide your ER care today - we hope that you feel better.  Rest. Drink plenty of fluids.  Please know that increasingly we are seeing marijuana as a cause of a recurrent vomiting and upper abdominal pain syndrome - it is likely that if you avoid thc use, your symptoms will improve/resolve.  Take your zofran as need for nausea.  Follow up with primary care doctor in the coming week.  Return to ER if worse, new symptoms, fevers, persistent vomiting, other concern.  You were given sedating medication in the ER  - no driving for the next 4 hours.

## 2018-01-05 NOTE — ED Notes (Signed)
Pt departed in NAD, refused use of wheelchair.  

## 2018-01-06 ENCOUNTER — Emergency Department (HOSPITAL_COMMUNITY)
Admission: EM | Admit: 2018-01-06 | Discharge: 2018-01-06 | Disposition: A | Discharge status: Home / Self Care | Payer: Medicaid Other | Attending: Emergency Medicine | Admitting: Emergency Medicine

## 2018-01-06 ENCOUNTER — Encounter (HOSPITAL_COMMUNITY): Payer: Self-pay | Admitting: Emergency Medicine

## 2018-01-06 ENCOUNTER — Other Ambulatory Visit: Payer: Self-pay

## 2018-01-06 DIAGNOSIS — R1114 Bilious vomiting: Secondary | ICD-10-CM

## 2018-01-06 LAB — BASIC METABOLIC PANEL
ANION GAP: 10 (ref 5–15)
BUN: 11 mg/dL (ref 6–20)
CALCIUM: 9.4 mg/dL (ref 8.9–10.3)
CO2: 24 mmol/L (ref 22–32)
Chloride: 105 mmol/L (ref 98–111)
Creatinine, Ser: 0.99 mg/dL (ref 0.61–1.24)
GFR calc Af Amer: >60 mL/min (ref >60)
GFR calc non Af Amer: >60 mL/min (ref >60)
GLUCOSE: 104 mg/dL — AB (ref 70–99)
Potassium: 3.4 mmol/L — ABNORMAL LOW (ref 3.5–5.1)
Sodium: 139 mmol/L (ref 135–145)

## 2018-01-06 LAB — LIPASE, BLOOD: Lipase: 27 U/L (ref 11–51)

## 2018-01-06 MED ORDER — ONDANSETRON HCL 4 MG/2ML IJ SOLN
4.0000 mg | Freq: Once | INTRAMUSCULAR | Status: AC
  Administered 2018-01-06: 4 mg via INTRAVENOUS
  Filled 2018-01-06: qty 2

## 2018-01-06 MED ORDER — PROMETHAZINE HCL 25 MG/ML IJ SOLN
12.5000 mg | Freq: Once | INTRAMUSCULAR | Status: AC
  Administered 2018-01-06: 12.5 mg via INTRAVENOUS
  Filled 2018-01-06: qty 1

## 2018-01-06 MED ORDER — FAMOTIDINE IN NACL 20-0.9 MG/50ML-% IV SOLN
20.0000 mg | Freq: Two times a day (BID) | INTRAVENOUS | Status: DC
  Administered 2018-01-06: 20 mg via INTRAVENOUS
  Filled 2018-01-06: qty 50

## 2018-01-06 MED ORDER — SODIUM CHLORIDE 0.9 % IV BOLUS
1000.0000 mL | Freq: Once | INTRAVENOUS | Status: AC
  Administered 2018-01-06: 1000 mL via INTRAVENOUS

## 2018-01-06 MED ORDER — PANTOPRAZOLE SODIUM 20 MG PO TBEC: 20.0000 mg | DELAYED_RELEASE_TABLET | Freq: Every day | ORAL | 0 refills | Status: DC

## 2018-01-06 MED ORDER — PROMETHAZINE HCL 25 MG RE SUPP: 25.0000 mg | Freq: Four times a day (QID) | RECTAL | 0 refills | Status: DC | PRN

## 2018-01-06 NOTE — ED Notes (Signed)
Patient verbalizes understanding of discharge instructions. Opportunity for questioning and answers were provided. Armband removed by staff, pt discharged from ED ambulatory.   

## 2018-01-06 NOTE — ED Provider Notes (Signed)
[facility] EMERGENCY DEPARTMENT Provider Note   CSN: [id number] Arrival date & time: 01/06/18  1031     History   Chief Complaint Chief Complaint  Patient presents with  . Emesis    HPI Steven Burch is a 22 y.o. male.  22 year old male presents with complaint of nausea and vomiting.  Patient states symptoms started yesterday, similar to episodes of vomiting that he has been experiencing for the past 4 years.  Denies abdominal pain, fevers, chills, changes in bowel or bladder habits, travel or sick contacts.  Patient was seen in the emergency room yesterday for same, vomiting controlled prior to discharge home with prescription for Zofran.  Patient states he tried to take Zofran this morning but was unable to keep the dissolvable tablet down.  Denies blood in emesis or stools.  Patient states he is tried to follow-up with GI however they require referral and I told him the ER referral is not sufficient.  Patient admits to marijuana use, states he did quit smoking marijuana for 6 months and did not have any episodes of vomiting during that time however does not have an appetite if he does not smoke marijuana and can go 4 to 5 days without eating. No other complaints or concerns.      Past Medical History:  Diagnosis Date  . Multiple gastric ulcers     There are no active problems to display for this patient.   Past Surgical History:  Procedure Laterality Date  . ORTHOPEDIC SURGERY          Home Medications    Prior to Admission medications   Medication Sig Start Date End Date Taking? Authorizing Provider  ondansetron (ZOFRAN-ODT) 4 MG disintegrating tablet Take 1 tablet (4 mg total) by mouth every 8 (eight) hours as needed for nausea or vomiting. 06/23/17  Yes [place] Scrape, Alyssa B, PA-C  pantoprazole (PROTONIX) 20 MG tablet Take 1 tablet (20 mg total) by mouth daily. 01/06/18 02/05/18  Jeannie Fend, PA-C  promethazine (PHENERGAN) 25 MG suppository Place  1 suppository (25 mg total) rectally every 6 (six) hours as needed for nausea or vomiting. 01/06/18   Jeannie Fend, PA-C    Family History Family History  Problem Relation Age of Onset  . Heart disease Other     Social History Social History   Tobacco Use  . Smoking status: Never Smoker  . Smokeless tobacco: Never Used  Substance Use Topics  . Alcohol use: No  . Drug use: Yes    Frequency: 0.5 times per week    Types: Marijuana    Comment: last use 1 month ago     Allergies   Nsaids   Review of Systems Review of Systems  Constitutional: Negative for chills and fever.  Respiratory: Negative for shortness of breath.   Cardiovascular: Negative for chest pain.  Gastrointestinal: Positive for nausea and vomiting. Negative for abdominal pain, constipation and diarrhea.  Genitourinary: Positive for decreased urine volume. Negative for dysuria.  Musculoskeletal: Negative for arthralgias and myalgias.  Skin: Negative for rash and wound.  Allergic/Immunologic: Negative for immunocompromised state.  Neurological: Negative for weakness and numbness.  Hematological: Negative for adenopathy.  Psychiatric/Behavioral: Negative for confusion.  All other systems reviewed and are negative.    Physical Exam Updated Vital Signs BP 122/61   Pulse 61   Temp 98.5 F (36.9 C) (Oral)   Resp 16   Ht 5\' 8"  (1.727 m)   Wt 70.3 kg (155 lb)  SpO2 96%   BMI 23.57 kg/m   Physical Exam  Constitutional: He is oriented to person, place, and time. He appears well-developed and well-nourished. No distress.  Eyes: Conjunctivae are normal.  Cardiovascular: Normal rate, regular rhythm, normal heart sounds and intact distal pulses.  No murmur heard. Pulmonary/Chest: Effort normal and breath sounds normal. No respiratory distress.  Abdominal: Soft. He exhibits no distension. There is no tenderness.  Neurological: He is alert and oriented to person, place, and time.  Skin: Skin is warm and  dry. He is not diaphoretic.  Psychiatric: He has a normal mood and affect. His behavior is normal.  Nursing note and vitals reviewed.    ED Treatments / Results  Labs (all labs ordered are listed, but only abnormal results are displayed) Labs Reviewed  BASIC METABOLIC PANEL - Abnormal; Notable for the following components:      Result Value   Potassium 3.4 (*)    Glucose, Bld 104 (*)    All other components within normal limits  LIPASE, BLOOD    EKG None  Radiology No results found.  Procedures Procedures (including critical care time)  Medications Ordered in ED Medications  famotidine (PEPCID) IVPB 20 mg premix (0 mg Intravenous Stopped 01/06/18 1454)  sodium chloride 0.9 % bolus 1,000 mL (0 mLs Intravenous Stopped 01/06/18 1454)  ondansetron (ZOFRAN) injection 4 mg (4 mg Intravenous Given 01/06/18 1249)  promethazine (PHENERGAN) injection 12.5 mg (12.5 mg Intravenous Given 01/06/18 1556)     Initial Impression / Assessment and Plan / ED Course  I have reviewed the triage vital signs and the nursing notes.  Pertinent labs & imaging results that were available during my care of the patient were reviewed by me and considered in my medical decision making (see chart for details).  Clinical Course as of Jan 06 1646  Sat Jan 06, 2018  1519 22yo male presents with nausea/vomiting onset yesterday, seen in the ER yesterday for this and dc home with Zofran ODT. Patient states he tried to take this today but was unable to keep it down so he came back to the Er. Reports marijuana use and states he did stop vomiting for the 6 months that he quit however does not have an appetite if he does not smoke. No abdominal pain/fevers/changes in bowel or bladder habits. Exam unremarkable beyond bilious emesis in bag. Patient was given 1LNS and zofran, continued to vomit and phenergan was ordered. Due to an emergency on the floor, administration of phenergan was delayed, patient is no longer  vomiting but is afraid to PO challenge as he is still very nauseous. Patient will PO challenge after phenergan, if no longer vomiting he may be dc with phenergan PR and GI follow up.   [LM]  1646 Tolerating p.o. fluids will be discharged.   [LM]    Clinical Course User Index [LM] Jeannie FendMurphy, Laura A, PA-C    Final Clinical Impressions(s) / ED Diagnoses   Final diagnoses:  Bilious vomiting with nausea    ED Discharge Orders        Ordered    promethazine (PHENERGAN) 25 MG suppository  Every 6 hours PRN     01/06/18 1640    pantoprazole (PROTONIX) 20 MG tablet  Daily     01/06/18 1640       Jeannie FendMurphy, Laura A, PA-C 01/06/18 1647    Rolan BuccoBelfi, Melanie, MD 01/06/18 1651

## 2018-01-06 NOTE — Discharge Instructions (Signed)
Bland diet and advance as tolerated. Use Phenergan suppositories for vomiting not controlled with your Zofran dissolvable tablet. Referral given for [facility], please call to make an appointment. Return to ER for worsening or concerning symptoms.

## 2018-01-06 NOTE — ED Notes (Signed)
Gave patient a ginger ale and some saltine crackers patient is resting with call bell in reach

## 2018-01-06 NOTE — ED Triage Notes (Signed)
States seen here yesterday and got fluid and left feeling fine. Now throwing up and puts zofran in mouth and cant hold it down.

## 2018-01-07 ENCOUNTER — Other Ambulatory Visit: Payer: Self-pay

## 2018-01-07 ENCOUNTER — Emergency Department (HOSPITAL_COMMUNITY)
Admission: EM | Admit: 2018-01-07 | Discharge: 2018-01-07 | Disposition: A | Discharge status: Home / Self Care | Payer: Medicaid Other | Attending: Emergency Medicine | Admitting: Emergency Medicine

## 2018-01-07 ENCOUNTER — Emergency Department (HOSPITAL_COMMUNITY): Payer: Medicaid Other

## 2018-01-07 ENCOUNTER — Encounter (HOSPITAL_COMMUNITY): Payer: Self-pay | Admitting: Emergency Medicine

## 2018-01-07 DIAGNOSIS — R1013 Epigastric pain: Secondary | ICD-10-CM

## 2018-01-07 DIAGNOSIS — G43A0 Cyclical vomiting, not intractable: Secondary | ICD-10-CM | POA: Insufficient documentation

## 2018-01-07 DIAGNOSIS — R1115 Cyclical vomiting syndrome unrelated to migraine: Secondary | ICD-10-CM

## 2018-01-07 LAB — URINALYSIS, ROUTINE W REFLEX MICROSCOPIC
Bilirubin Urine: NEGATIVE
GLUCOSE, UA: NEGATIVE mg/dL
Ketones, ur: 80 mg/dL — AB
Leukocytes, UA: NEGATIVE
Nitrite: NEGATIVE
PROTEIN: NEGATIVE mg/dL
Specific Gravity, Urine: 1.016 (ref 1.005–1.030)
pH: 9 — ABNORMAL HIGH (ref 5.0–8.0)

## 2018-01-07 LAB — CBC WITH DIFFERENTIAL/PLATELET
ABS IMMATURE GRANULOCYTES: 0 10*3/uL (ref 0.0–0.1)
Basophils Absolute: 0 10*3/uL (ref 0.0–0.1)
Basophils Relative: 0 %
Eosinophils Absolute: 0 10*3/uL (ref 0.0–0.7)
Eosinophils Relative: 0 %
HEMATOCRIT: 42.6 % (ref 39.0–52.0)
Hemoglobin: 14.1 g/dL (ref 13.0–17.0)
IMMATURE GRANULOCYTES: 0 %
LYMPHS ABS: 1.4 10*3/uL (ref 0.7–4.0)
LYMPHS PCT: 15 %
MCH: 28.8 pg (ref 26.0–34.0)
MCHC: 33.1 g/dL (ref 30.0–36.0)
MCV: 87.1 fL (ref 78.0–100.0)
Monocytes Absolute: 0.5 10*3/uL (ref 0.1–1.0)
Monocytes Relative: 5 %
NEUTROS ABS: 7.3 10*3/uL (ref 1.7–7.7)
Neutrophils Relative %: 80 %
Platelets: 186 10*3/uL (ref 150–400)
RBC: 4.89 MIL/uL (ref 4.22–5.81)
RDW: 13.2 % (ref 11.5–15.5)
WBC: 9.3 10*3/uL (ref 4.0–10.5)

## 2018-01-07 LAB — COMPREHENSIVE METABOLIC PANEL
ALT: 15 U/L (ref 0–44)
AST: 23 U/L (ref 15–41)
Albumin: 4.9 g/dL (ref 3.5–5.0)
Alkaline Phosphatase: 60 U/L (ref 38–126)
Anion gap: 14 (ref 5–15)
BUN: 10 mg/dL (ref 6–20)
CHLORIDE: 104 mmol/L (ref 98–111)
CO2: 21 mmol/L — AB (ref 22–32)
Calcium: 9.7 mg/dL (ref 8.9–10.3)
Creatinine, Ser: 0.94 mg/dL (ref 0.61–1.24)
GFR calc Af Amer: >60 mL/min (ref >60)
GFR calc non Af Amer: >60 mL/min (ref >60)
Glucose, Bld: 107 mg/dL — ABNORMAL HIGH (ref 70–99)
Potassium: 3.5 mmol/L (ref 3.5–5.1)
SODIUM: 139 mmol/L (ref 135–145)
Total Bilirubin: 1.4 mg/dL — ABNORMAL HIGH (ref 0.3–1.2)
Total Protein: 7.9 g/dL (ref 6.5–8.1)

## 2018-01-07 LAB — LIPASE, BLOOD: Lipase: 31 U/L (ref 11–51)

## 2018-01-07 LAB — RAPID URINE DRUG SCREEN, HOSP PERFORMED
AMPHETAMINES: NOT DETECTED
BARBITURATES: NOT DETECTED
Benzodiazepines: POSITIVE — AB
Cocaine: NOT DETECTED
Opiates: NOT DETECTED
Tetrahydrocannabinol: POSITIVE — AB

## 2018-01-07 MED ORDER — SODIUM CHLORIDE 0.9 % IV BOLUS
1000.0000 mL | Freq: Once | INTRAVENOUS | Status: AC
  Administered 2018-01-07: 1000 mL via INTRAVENOUS

## 2018-01-07 MED ORDER — PROMETHAZINE HCL 25 MG/ML IJ SOLN
12.5000 mg | Freq: Once | INTRAMUSCULAR | Status: AC
  Administered 2018-01-07: 12.5 mg via INTRAVENOUS
  Filled 2018-01-07: qty 1

## 2018-01-07 MED ORDER — CAPSAICIN 0.025 % EX CREA
TOPICAL_CREAM | Freq: Once | CUTANEOUS | Status: AC
  Administered 2018-01-07: 16:00:00 via TOPICAL
  Filled 2018-01-07: qty 60

## 2018-01-07 MED ORDER — PROMETHAZINE HCL 25 MG PO TABS: 25.0000 mg | ORAL_TABLET | Freq: Four times a day (QID) | ORAL | 0 refills | Status: DC | PRN

## 2018-01-07 NOTE — ED Provider Notes (Signed)
[facility] EMERGENCY DEPARTMENT Provider Note   CSN: [id number] Arrival date & time: 01/07/18  1011     History   Chief Complaint Chief Complaint  Patient presents with  . Emesis  . Nausea    HPI Steven Burch is a 22 y.o. male.  HPI   Steven Burch is a 22 y.o. male, with a history of gastric ulcers, presenting to the ED with nausea, vomiting, and abdominal pain beginning 3 days ago. States this is not a new issue for him.  States he has been told on multiple occasions to follow-up with a PCP and GI, but has not done so. He states he was feeling better yesterday, ate a salad last night, and then had no vomiting before bed.  Woke up this morning with epigastric pain, feeling like a tightness, nonradiating, moderate to severe, accompanied by nausea and vomiting.  States he has had "too many to count" episodes of nonbloody, nonbilious vomiting.  Last bowel movement was today and was normal. Patient endorses regular marijuana use. Denies fever/chills, diarrhea, constipation, hematochezia/melena, chest pain, shortness of breath, or any other complaints.   Past Medical History:  Diagnosis Date  . Multiple gastric ulcers     There are no active problems to display for this patient.   Past Surgical History:  Procedure Laterality Date  . ORTHOPEDIC SURGERY          Home Medications    Prior to Admission medications   Medication Sig Start Date End Date Taking? Authorizing Provider  ondansetron (ZOFRAN-ODT) 4 MG disintegrating tablet Take 1 tablet (4 mg total) by mouth every 8 (eight) hours as needed for nausea or vomiting. 06/23/17   Aviva Kluver B, PA-C  pantoprazole (PROTONIX) 20 MG tablet Take 1 tablet (20 mg total) by mouth daily. 01/06/18 02/05/18  Jeannie Fend, PA-C  promethazine (PHENERGAN) 25 MG suppository Place 1 suppository (25 mg total) rectally every 6 (six) hours as needed for nausea or vomiting. 01/06/18   Jeannie Fend, PA-C    promethazine (PHENERGAN) 25 MG tablet Take 1 tablet (25 mg total) by mouth every 6 (six) hours as needed for nausea or vomiting. 01/07/18   Eulah Walkup, Hillard Danker, PA-C    Family History Family History  Problem Relation Age of Onset  . Heart disease Other     Social History Social History   Tobacco Use  . Smoking status: Never Smoker  . Smokeless tobacco: Never Used  Substance Use Topics  . Alcohol use: No  . Drug use: Yes    Frequency: 0.5 times per week    Types: Marijuana    Comment: last use 1 month ago     Allergies   Nsaids   Review of Systems Review of Systems  Constitutional: Negative for chills and fever.  Respiratory: Negative for shortness of breath.   Cardiovascular: Negative for chest pain.  Gastrointestinal: Positive for abdominal pain, nausea and vomiting. Negative for blood in stool, constipation and diarrhea.  Genitourinary: Negative for dysuria and hematuria.  Musculoskeletal: Negative for back pain.  All other systems reviewed and are negative.    Physical Exam Updated Vital Signs BP 136/80 (BP Location: Right Arm)   Pulse (!) 55   Temp 98.4 F (36.9 C) (Oral)   Resp 16   SpO2 100%   Physical Exam  Constitutional: He appears well-developed and well-nourished. No distress.  HENT:  Head: Normocephalic and atraumatic.  Eyes: Conjunctivae are normal.  Neck: Neck supple.  Cardiovascular: Normal rate, regular rhythm, normal heart sounds and intact distal pulses.  Pulmonary/Chest: Effort normal and breath sounds normal. No respiratory distress.  Abdominal: Soft. There is tenderness in the epigastric area. There is no guarding.  Musculoskeletal: He exhibits no edema.  Lymphadenopathy:    He has no cervical adenopathy.  Neurological: He is alert.  Skin: Skin is warm and dry. He is not diaphoretic.  Psychiatric: He has a normal mood and affect. His behavior is normal.  Nursing note and vitals reviewed.    ED Treatments / Results  Labs (all labs  ordered are listed, but only abnormal results are displayed) Labs Reviewed  COMPREHENSIVE METABOLIC PANEL - Abnormal; Notable for the following components:      Result Value   CO2 21 (*)    Glucose, Bld 107 (*)    Total Bilirubin 1.4 (*)    All other components within normal limits  RAPID URINE DRUG SCREEN, HOSP PERFORMED - Abnormal; Notable for the following components:   Benzodiazepines POSITIVE (*)    Tetrahydrocannabinol POSITIVE (*)    All other components within normal limits  URINE CULTURE  LIPASE, BLOOD  CBC WITH DIFFERENTIAL/PLATELET  URINALYSIS, ROUTINE W REFLEX MICROSCOPIC    EKG None  Radiology [place] Abdomen Limited Ruq  Result Date: 01/07/2018 CLINICAL DATA:  Epigastric pain EXAM: ULTRASOUND ABDOMEN LIMITED RIGHT UPPER QUADRANT COMPARISON:  1/12/9 FINDINGS: Gallbladder: No gallstones or wall thickening visualized. No sonographic Murphy sign noted by sonographer. Common bile duct: Diameter: 3 mm Liver: No focal lesion identified. Within normal limits in parenchymal echogenicity. Portal vein is patent on color Doppler imaging with normal direction of blood flow towards the liver. IMPRESSION: No acute abnormality noted. Electronically Signed   By: Alcide CleverMark  Lukens M.D.   On: 01/07/2018 13:13    Procedures Procedures (including critical care time)  Medications Ordered in ED Medications  sodium chloride 0.9 % bolus 1,000 mL (0 mLs Intravenous Stopped 01/07/18 1502)  promethazine (PHENERGAN) injection 12.5 mg (12.5 mg Intravenous Given 01/07/18 1310)  capsaicin (ZOSTRIX) 0.025 % cream ( Topical Given 01/07/18 1530)     Initial Impression / Assessment and Plan / ED Course  I have reviewed the triage vital signs and the nursing notes.  Pertinent labs & imaging results that were available during my care of the patient were reviewed by me and considered in my medical decision making (see chart for details).  Clinical Course as of Jan 07 1621  Sun Jan 07, 2018  1555 Though the  patient is positive for benzodiazepines, I do not think he is withdrawing at this time.  He has not been ill appearing, tachycardic, diaphoretic, and his symptoms resolved with minimal intervention that did not include benzodiazepines.  Benzodiazepines(!): POSITIVE [SJ]  1609 Patient is telling the nurse that he wants to go home.  He does not want to wait for any further results.  He states he is not having any urinary symptoms.   [SJ]    Clinical Course User Index [SJ] Markea Ruzich C, PA-C    Patient presents with epigastric pain along with nausea and vomiting. Patient is nontoxic appearing, afebrile, not tachycardic, not tachypneic, and not hypotensive. Symptoms resolved with conservative management.  Lab results suggest some dehydration, otherwise reassuring.  Abdominal exam benign following resolution in vomiting. Tolerating PO at discharge. Advised to follow up with PCP, resources given.  The patient was given instructions for home care as well as return precautions. Patient voices understanding of these instructions, accepts the plan,  and is comfortable with discharge.  Final Clinical Impressions(s) / ED Diagnoses   Final diagnoses:  Epigastric pain  Non-intractable cyclical vomiting with nausea    ED Discharge Orders        Ordered    promethazine (PHENERGAN) 25 MG tablet  Every 6 hours PRN     01/07/18 1610       Anselm Pancoast, PA-C 01/07/18 1622    Jacalyn Lefevre, MD 01/08/18 0700

## 2018-01-07 NOTE — ED Triage Notes (Signed)
Pt. Stated, Steven Burch Atlasve been here the last 2 days with same symptoms, N/V. I was given med. And not got by the drug store yet. RX Phnergan instead of Zofran.

## 2018-01-07 NOTE — Discharge Instructions (Addendum)
Nausea and Vomiting  Hand washing: Wash your hands throughout the day, but especially before and after touching the face, using the restroom, sneezing, coughing, or touching surfaces that have been coughed or sneezed upon. Hydration: Symptoms will be intensified and complicated by dehydration. Dehydration can also extend the duration of symptoms. Drink plenty of fluids and get plenty of rest. You should be drinking at least half a liter of water an hour to stay hydrated. Electrolyte drinks (ex. Gatorade, Powerade, Pedialyte) are also encouraged. You should be drinking enough fluids to make your urine light yellow, almost clear. If this is not the case, you are not drinking enough water. Please note that some of the treatments indicated below will not be effective if you are not adequately hydrated. Diet: Please concentrate on hydration, however, you may introduce food slowly.  Start with a clear liquid diet, progressed to a full liquid diet, and then bland solids as you are able. Pain or fever: Tylenol  Nausea/vomiting: Use the Phenergan for nausea or vomiting. Follow-up: Follow-up with a primary care provider on this matter. Return: Return should you develop a fever, bloody diarrhea, increased abdominal pain, uncontrolled vomiting, or any other major concerns.  Using marijuana and other THC products can be a cause of cyclical vomiting.

## 2018-01-08 ENCOUNTER — Encounter (HOSPITAL_COMMUNITY): Payer: Self-pay

## 2018-01-08 ENCOUNTER — Emergency Department (HOSPITAL_COMMUNITY)
Admission: EM | Admit: 2018-01-08 | Discharge: 2018-01-08 | Disposition: A | Discharge status: Home / Self Care | Payer: Medicaid Other | Attending: Emergency Medicine | Admitting: Emergency Medicine

## 2018-01-08 ENCOUNTER — Encounter: Payer: Self-pay | Admitting: *Deleted

## 2018-01-08 DIAGNOSIS — Z79899 Other long term (current) drug therapy: Secondary | ICD-10-CM | POA: Insufficient documentation

## 2018-01-08 DIAGNOSIS — K2950 Unspecified chronic gastritis without bleeding: Secondary | ICD-10-CM | POA: Insufficient documentation

## 2018-01-08 DIAGNOSIS — R112 Nausea with vomiting, unspecified: Secondary | ICD-10-CM | POA: Insufficient documentation

## 2018-01-08 LAB — CBC
HEMATOCRIT: 45.7 % (ref 39.0–52.0)
Hemoglobin: 15.3 g/dL (ref 13.0–17.0)
MCH: 29 pg (ref 26.0–34.0)
MCHC: 33.5 g/dL (ref 30.0–36.0)
MCV: 86.6 fL (ref 78.0–100.0)
PLATELETS: 242 10*3/uL (ref 150–400)
RBC: 5.28 MIL/uL (ref 4.22–5.81)
RDW: 13 % (ref 11.5–15.5)
WBC: 11.3 10*3/uL — AB (ref 4.0–10.5)

## 2018-01-08 LAB — URINE CULTURE: Culture: NO GROWTH

## 2018-01-08 LAB — URINALYSIS, ROUTINE W REFLEX MICROSCOPIC
BILIRUBIN URINE: NEGATIVE
Glucose, UA: NEGATIVE mg/dL
Ketones, ur: 80 mg/dL — AB
Leukocytes, UA: NEGATIVE
Nitrite: NEGATIVE
PH: 5 (ref 5.0–8.0)
Protein, ur: 30 mg/dL — AB
SPECIFIC GRAVITY, URINE: 1.031 — AB (ref 1.005–1.030)

## 2018-01-08 LAB — COMPREHENSIVE METABOLIC PANEL
ALBUMIN: 4.7 g/dL (ref 3.5–5.0)
ALT: 16 U/L (ref 0–44)
AST: 22 U/L (ref 15–41)
Alkaline Phosphatase: 61 U/L (ref 38–126)
Anion gap: 15 (ref 5–15)
BUN: 12 mg/dL (ref 6–20)
CHLORIDE: 101 mmol/L (ref 98–111)
CO2: 25 mmol/L (ref 22–32)
CREATININE: 1.02 mg/dL (ref 0.61–1.24)
Calcium: 9.9 mg/dL (ref 8.9–10.3)
GFR calc Af Amer: >60 mL/min (ref >60)
GFR calc non Af Amer: >60 mL/min (ref >60)
GLUCOSE: 107 mg/dL — AB (ref 70–99)
POTASSIUM: 3.3 mmol/L — AB (ref 3.5–5.1)
SODIUM: 141 mmol/L (ref 135–145)
Total Bilirubin: 1.7 mg/dL — ABNORMAL HIGH (ref 0.3–1.2)
Total Protein: 7.5 g/dL (ref 6.5–8.1)

## 2018-01-08 LAB — LIPASE, BLOOD: LIPASE: 30 U/L (ref 11–51)

## 2018-01-08 MED ORDER — SODIUM CHLORIDE 0.9 % IV BOLUS
1000.0000 mL | Freq: Once | INTRAVENOUS | Status: AC
  Administered 2018-01-08: 1000 mL via INTRAVENOUS

## 2018-01-08 MED ORDER — POTASSIUM CHLORIDE CRYS ER 20 MEQ PO TBCR
20.0000 meq | EXTENDED_RELEASE_TABLET | Freq: Once | ORAL | Status: AC
  Administered 2018-01-08: 20 meq via ORAL
  Filled 2018-01-08: qty 1

## 2018-01-08 MED ORDER — PROMETHAZINE HCL 25 MG RE SUPP: 25.0000 mg | Freq: Four times a day (QID) | RECTAL | 0 refills | Status: DC | PRN

## 2018-01-08 MED ORDER — PROMETHAZINE HCL 25 MG/ML IJ SOLN
25.0000 mg | Freq: Once | INTRAMUSCULAR | Status: AC
  Administered 2018-01-08: 25 mg via INTRAVENOUS
  Filled 2018-01-08: qty 1

## 2018-01-08 MED ORDER — FAMOTIDINE IN NACL 20-0.9 MG/50ML-% IV SOLN
20.0000 mg | Freq: Once | INTRAVENOUS | Status: AC
  Administered 2018-01-08: 20 mg via INTRAVENOUS
  Filled 2018-01-08: qty 50

## 2018-01-08 NOTE — Progress Notes (Signed)
Patient could use assistance with PCP follow up. He unfortunately did not want to wait for these resources.

## 2018-01-08 NOTE — Discharge Instructions (Signed)
Please call and schedule an appointment [facility].  I have listed the information to their office below.  I have also listed the information to [facility], which is located across the street and who accepts patients that do not have insurance.  I have written you a paper prescription for Phenergan, you can take this to any pharmacy to get filled.  Please take as needed for nausea and vomiting.  Return to the ER if you have any new or concerning symptoms like worsening abdominal pain, vomiting that does not stop despite medicine, fever, trouble swallowing.

## 2018-01-08 NOTE — ED Provider Notes (Signed)
[facility] EMERGENCY DEPARTMENT Provider Note   CSN: [id number] Arrival date & time: 01/08/18  9562     History   Chief Complaint Chief Complaint  Patient presents with  . Abdominal Pain    HPI Steven Burch is a 22 y.o. male.  HPI   Steven Burch is a 22 year old male with a history of gastritis, marijuana use, vomiting who presents to the emergency department for evaluation of emesis and dehydration.  Patient has been seen in the emergency department for these symptoms four times in the past three days.  He reports that he was unable to get his Phenergan filled at the pharmacy because they were out of the medicine.  States that he has had about 12 episodes of nonbloody emesis overnight.  His epigastric pain has improved from prior visits, reports that it is about a 4/10 in severity at this time which is much improved from prior.  He has reportedly never had an endoscopy procedure.  He has been placed on omeprazole, but states that this makes his stomach feel "weird." He has been discharged with information to follow-up with [facility] gastroenterology, but every time he called the office he is told that he needs a referral by a primary doctor which he does not have.  Has a history of marijuana use, but has not used this in 7 days because he has been told that it can contribute to his symptoms.  He denies coffee-ground emesis.  He denies fevers, chills, diarrhea, constipation, dysuria, urinary frequency, chest pain, shortness of breath, lightheadedness or syncope.  He does state that he feels generally weak and he thinks that this is due to dehydration.  No prior abdominal surgeries.  Past Medical History:  Diagnosis Date  . Multiple gastric ulcers     There are no active problems to display for this patient.   Past Surgical History:  Procedure Laterality Date  . ORTHOPEDIC SURGERY          Home Medications    Prior to Admission medications   Medication  Sig Start Date End Date Taking? Authorizing Provider  ondansetron (ZOFRAN-ODT) 4 MG disintegrating tablet Take 1 tablet (4 mg total) by mouth every 8 (eight) hours as needed for nausea or vomiting. 06/23/17   Aviva Kluver B, PA-C  pantoprazole (PROTONIX) 20 MG tablet Take 1 tablet (20 mg total) by mouth daily. 01/06/18 02/05/18  Jeannie Fend, PA-C  promethazine (PHENERGAN) 25 MG suppository Place 1 suppository (25 mg total) rectally every 6 (six) hours as needed for nausea or vomiting. 01/06/18   Jeannie Fend, PA-C  promethazine (PHENERGAN) 25 MG tablet Take 1 tablet (25 mg total) by mouth every 6 (six) hours as needed for nausea or vomiting. 01/07/18   Joy, Hillard Danker, PA-C    Family History Family History  Problem Relation Age of Onset  . Heart disease Other     Social History Social History   Tobacco Use  . Smoking status: Never Smoker  . Smokeless tobacco: Never Used  Substance Use Topics  . Alcohol use: No  . Drug use: Yes    Frequency: 0.5 times per week    Types: Marijuana    Comment: last use 1 month ago     Allergies   Nsaids   Review of Systems Review of Systems  Constitutional: Negative for chills and fever.  Eyes: Negative for visual disturbance.  Respiratory: Negative for shortness of breath.   Cardiovascular: Negative for chest pain.  Gastrointestinal: Positive for abdominal pain (epigastric), nausea and vomiting. Negative for anal bleeding, blood in stool and diarrhea.  Genitourinary: Negative for difficulty urinating, dysuria, flank pain and frequency.  Musculoskeletal: Negative for back pain.  Skin: Negative for rash.  Neurological: Positive for weakness (generalized). Negative for syncope and light-headedness.  Psychiatric/Behavioral: Negative for agitation.     Physical Exam Updated Vital Signs BP 122/60   Pulse 68   Temp 97.9 F (36.6 C) (Oral)   Resp 20   SpO2 98%   Physical Exam  Constitutional: He is oriented to person, place, and time.  He appears well-developed and well-nourished. No distress.  Nontoxic-appearing.  HENT:  Head: Normocephalic and atraumatic.  Mucous membranes dry.  Eyes: Pupils are equal, round, and reactive to light. Conjunctivae are normal. Right eye exhibits no discharge. Left eye exhibits no discharge.  Neck: Normal range of motion.  Cardiovascular: Normal rate and regular rhythm.  No murmur heard. Pulmonary/Chest: Effort normal and breath sounds normal. No stridor. No respiratory distress. He has no wheezes. He has no rales.  Abdominal:  Abdomen soft and nondistended.  Bowel sounds normoactive.  Abdomen nontender to palpation.  No CVA tenderness.  Negative Murphy's sign.  Musculoskeletal: Normal range of motion.  Neurological: He is alert and oriented to person, place, and time. Coordination normal.  Skin: Skin is warm and dry. Capillary refill takes less than 2 seconds. He is not diaphoretic.  Psychiatric: He has a normal mood and affect. His behavior is normal.  Nursing note and vitals reviewed.    ED Treatments / Results  Labs (all labs ordered are listed, but only abnormal results are displayed) Labs Reviewed  COMPREHENSIVE METABOLIC PANEL - Abnormal; Notable for the following components:      Result Value   Potassium 3.3 (*)    Glucose, Bld 107 (*)    Total Bilirubin 1.7 (*)    All other components within normal limits  CBC - Abnormal; Notable for the following components:   WBC 11.3 (*)    All other components within normal limits  URINALYSIS, ROUTINE W REFLEX MICROSCOPIC - Abnormal; Notable for the following components:   Color, Urine AMBER (*)    Specific Gravity, Urine 1.031 (*)    Hgb urine dipstick SMALL (*)    Ketones, ur 80 (*)    Protein, ur 30 (*)    Bacteria, UA FEW (*)    All other components within normal limits  LIPASE, BLOOD    EKG None  Radiology Koreas Abdomen Limited Ruq  Result Date: 01/07/2018 CLINICAL DATA:  Epigastric pain EXAM: ULTRASOUND ABDOMEN LIMITED  RIGHT UPPER QUADRANT COMPARISON:  1/12/9 FINDINGS: Gallbladder: No gallstones or wall thickening visualized. No sonographic Murphy sign noted by sonographer. Common bile duct: Diameter: 3 mm Liver: No focal lesion identified. Within normal limits in parenchymal echogenicity. Portal vein is patent on color Doppler imaging with normal direction of blood flow towards the liver. IMPRESSION: No acute abnormality noted. Electronically Signed   By: Alcide CleverMark  Lukens M.D.   On: 01/07/2018 13:13    Procedures Procedures (including critical care time)  Medications Ordered in ED Medications  sodium chloride 0.9 % bolus 1,000 mL (1,000 mLs Intravenous New Bag/Given 01/08/18 0805)  famotidine (PEPCID) IVPB 20 mg premix (20 mg Intravenous New Bag/Given 01/08/18 0807)  promethazine (PHENERGAN) injection 25 mg (25 mg Intravenous Given 01/08/18 0807)  potassium chloride SA (K-DUR,KLOR-CON) CR tablet 20 mEq (20 mEq Oral Given 01/08/18 0806)     Initial Impression / Assessment  and Plan / ED Course  I have reviewed the triage vital signs and the nursing notes.  Pertinent labs & imaging results that were available during my care of the patient were reviewed by me and considered in my medical decision making (see chart for details).     Patient with a history of gastritis presents the emergency department for evaluation of emesis and dehydration.  He reports epigastric pain, but reports this is much improved from prior episodes of pain.  He was unable to get Phenergan at his pharmacy yesterday evening.  On exam he is afebrile and his vital signs are stable.  He appears dehydrated with dry mucous membranes.  No abdominal tenderness to palpation.  Per chart review, he did have a right upper quadrant ultrasound yesterday which was negative.  Labs today reveal slight leukocytosis WBC 11.3, this is improved from 12.2 three days ago and likely reactive from vomiting.  Hemoglobin WNL and patient denies bloody emesis or  coffee-ground emesis, do not suspect emergent bleeding ulcer.  UA unremarkable.  Lipase negative.  CMP with slight hypokalemia (K+ 3.3), this was replaced with po potassium in the ED.   Patient given fluids, anti-emetic and pepcid in the ED. On recheck he is tolerating p.o. fluids, reports improvement of symptoms.  Repeat abdominal exam soft and nontender.  His symptoms are consistent with gastritis.  Do not think further imaging/lab work is indicated at this time given symptom improvement and history of the same.  Will discharge with paper prescription for Phenergan that patient can take to another pharmacy given that his pharmacy has not been able to fill the Phenergan that was sent electronically yesterday.  I have also given patient the information to follow-up with [facility] gastroenterology, since he has been unsuccessful at making an appointment with [facility].  Have also given him the information to follow-up with [facility]. Counseled him on return precautions and he agrees.    Final Clinical Impressions(s) / ED Diagnoses   Final diagnoses:  Chronic gastritis without bleeding, unspecified gastritis type  Non-intractable vomiting with nausea, unspecified vomiting type    ED Discharge Orders        Ordered    promethazine (PHENERGAN) 25 MG suppository  Every 6 hours PRN     01/08/18 0925       Kellie Shropshire, PA-C 01/08/18 1610    Mancel Bale, MD 01/10/18 1144

## 2018-01-08 NOTE — ED Triage Notes (Signed)
Pt states that for the past four days he has been having n/v with upper abd pain, denies diarrhea or fevers.

## 2018-02-08 ENCOUNTER — Encounter (HOSPITAL_COMMUNITY): Payer: Self-pay | Admitting: Emergency Medicine

## 2018-02-08 ENCOUNTER — Emergency Department (HOSPITAL_COMMUNITY)
Admission: EM | Admit: 2018-02-08 | Discharge: 2018-02-08 | Disposition: A | Discharge status: Home / Self Care | Payer: Medicaid Other | Attending: Emergency Medicine | Admitting: Emergency Medicine

## 2018-02-08 ENCOUNTER — Other Ambulatory Visit: Payer: Self-pay

## 2018-02-08 DIAGNOSIS — K2950 Unspecified chronic gastritis without bleeding: Secondary | ICD-10-CM | POA: Insufficient documentation

## 2018-02-08 LAB — COMPREHENSIVE METABOLIC PANEL
ALT: 19 U/L (ref 0–44)
AST: 23 U/L (ref 15–41)
Albumin: 5.3 g/dL — ABNORMAL HIGH (ref 3.5–5.0)
Alkaline Phosphatase: 62 U/L (ref 38–126)
Anion gap: 16 — ABNORMAL HIGH (ref 5–15)
BUN: 15 mg/dL (ref 6–20)
CO2: 22 mmol/L (ref 22–32)
Calcium: 10.3 mg/dL (ref 8.9–10.3)
Chloride: 103 mmol/L (ref 98–111)
Creatinine, Ser: 1.12 mg/dL (ref 0.61–1.24)
Glucose, Bld: 113 mg/dL — ABNORMAL HIGH (ref 70–99)
Potassium: 3.7 mmol/L (ref 3.5–5.1)
Sodium: 141 mmol/L (ref 135–145)
Total Bilirubin: 1.5 mg/dL — ABNORMAL HIGH (ref 0.3–1.2)
Total Protein: 7.9 g/dL (ref 6.5–8.1)

## 2018-02-08 LAB — I-STAT CHEM 8, ED
BUN: 17 mg/dL (ref 6–20)
CHLORIDE: 106 mmol/L (ref 98–111)
CREATININE: 1 mg/dL (ref 0.61–1.24)
Calcium, Ion: 1.18 mmol/L (ref 1.15–1.40)
Glucose, Bld: 93 mg/dL (ref 70–99)
HEMATOCRIT: 43 % (ref 39.0–52.0)
Hemoglobin: 14.6 g/dL (ref 13.0–17.0)
Potassium: 3.9 mmol/L (ref 3.5–5.1)
SODIUM: 141 mmol/L (ref 135–145)
TCO2: 24 mmol/L (ref 22–32)

## 2018-02-08 LAB — URINALYSIS, ROUTINE W REFLEX MICROSCOPIC
BILIRUBIN URINE: NEGATIVE
Bacteria, UA: NONE SEEN
Glucose, UA: NEGATIVE mg/dL
HGB URINE DIPSTICK: NEGATIVE
KETONES UR: 80 mg/dL — AB
LEUKOCYTES UA: NEGATIVE
NITRITE: NEGATIVE
PROTEIN: 100 mg/dL — AB
Specific Gravity, Urine: 1.033 — ABNORMAL HIGH (ref 1.005–1.030)
pH: 6 (ref 5.0–8.0)

## 2018-02-08 LAB — CBC
HCT: 47.1 % (ref 39.0–52.0)
Hemoglobin: 15.8 g/dL (ref 13.0–17.0)
MCH: 28.8 pg (ref 26.0–34.0)
MCHC: 33.5 g/dL (ref 30.0–36.0)
MCV: 85.9 fL (ref 78.0–100.0)
Platelets: 278 10*3/uL (ref 150–400)
RBC: 5.48 MIL/uL (ref 4.22–5.81)
RDW: 12.8 % (ref 11.5–15.5)
WBC: 10.5 10*3/uL (ref 4.0–10.5)

## 2018-02-08 LAB — I-STAT CG4 LACTIC ACID, ED
LACTIC ACID, VENOUS: 2.28 mmol/L — AB (ref 0.5–1.9)
Lactic Acid, Venous: 1.28 mmol/L (ref 0.5–1.9)

## 2018-02-08 LAB — LIPASE, BLOOD: Lipase: 28 U/L (ref 11–51)

## 2018-02-08 MED ORDER — OMEPRAZOLE 40 MG PO CPDR: 40.0000 mg | DELAYED_RELEASE_CAPSULE | Freq: Every day | ORAL | 0 refills | Status: DC

## 2018-02-08 MED ORDER — FAMOTIDINE IN NACL 20-0.9 MG/50ML-% IV SOLN
20.0000 mg | Freq: Once | INTRAVENOUS | Status: AC
  Administered 2018-02-08: 20 mg via INTRAVENOUS
  Filled 2018-02-08: qty 50

## 2018-02-08 MED ORDER — SODIUM CHLORIDE 0.9 % IV BOLUS
1000.0000 mL | Freq: Once | INTRAVENOUS | Status: AC
  Administered 2018-02-08: 1000 mL via INTRAVENOUS

## 2018-02-08 MED ORDER — PROMETHAZINE HCL 25 MG/ML IJ SOLN
25.0000 mg | Freq: Once | INTRAMUSCULAR | Status: DC
  Filled 2018-02-08 (×4): qty 1

## 2018-02-08 MED ORDER — PROMETHAZINE HCL 25 MG/ML IJ SOLN
25.0000 mg | Freq: Once | INTRAMUSCULAR | Status: AC
  Administered 2018-02-08: 25 mg via INTRAVENOUS
  Filled 2018-02-08: qty 1

## 2018-02-08 MED ORDER — PROMETHAZINE HCL 25 MG RE SUPP: 25.0000 mg | Freq: Four times a day (QID) | RECTAL | 0 refills | Status: DC | PRN

## 2018-02-08 MED ORDER — ONDANSETRON HCL 4 MG/2ML IJ SOLN
4.0000 mg | Freq: Once | INTRAMUSCULAR | Status: AC
  Administered 2018-02-08: 4 mg via INTRAVENOUS
  Filled 2018-02-08: qty 2

## 2018-02-08 MED ORDER — PROMETHAZINE HCL 25 MG/ML IJ SOLN
25.0000 mg | Freq: Once | INTRAMUSCULAR | Status: DC
  Filled 2018-02-08: qty 1

## 2018-02-08 NOTE — Discharge Instructions (Addendum)
Please schedule an appointment to follow-up with the GI doctor.  Please start taking Prilosec daily.  I have written you prescription for Phenergan as needed for vomiting.  Stay hydrated with plenty of fluids at home.  Avoid marijuana.  Return to the ER if you have any new or concerning symptoms like vomiting that will not stop, fever or worsening abdominal pain.

## 2018-02-08 NOTE — ED Triage Notes (Signed)
Pt reports having a hx of stomach ulcers for 4 years. Pt reports vomiting x 3 days, unable to keep anything down. Pt reports mild abdominal pain, he notices when he is vomiting. Pt reports taking phenergan at home with no relief.

## 2018-02-08 NOTE — ED Provider Notes (Signed)
[facility] EMERGENCY DEPARTMENT Provider Note   CSN: [id number] Arrival date & time: 02/08/18  1541     History   Chief Complaint Chief Complaint  Patient presents with  . Emesis    HPI GOPAL MALTER is a 22 y.o. male.  HPI   Raymundo Rout is a 22 year old male with a history of recurrent epigastric pain with vomiting and marijuana use who presents to the emergency department for evaluation of epigastric pain with vomiting over the past 3 days.  Patient states that his symptoms are similar to prior episodes.  States that he has had 10-15 episodes of nonbloody, nonbilious emesis daily.  He also reports 3/10 severity nonradiating epigastric pain which is constant, but worsened with vomiting.  He has not taken any antacids.  Has taken omeprazole in the past, but states that the directions on the box said not to take this longer than 14 days so he has not had this for a while.  Reports occasional marijuana use, although none over the past several days.  He has been seen for the symptoms 7 times over the past year and has been referred to gastroenterology, although he has not followed up because he has been told he needs to establish care with a primary doctor first.  States that he is planning to call and schedule an appointment with primary care doctor tomorrow.  He has never had an endoscopy procedure.  He denies sick contacts with similar symptoms.  Denies fevers, chills, diarrhea, melena, hematochezia, dysuria, urinary frequency, flank pain or back pain, chest pain, shortness of breath, lightheadedness or syncope.  No prior abdominal surgeries.  He had a bowel movement earlier today which was normal for him.  Past Medical History:  Diagnosis Date  . Multiple gastric ulcers     There are no active problems to display for this patient.   Past Surgical History:  Procedure Laterality Date  . ORTHOPEDIC SURGERY          Home Medications    Prior to Admission  medications   Medication Sig Start Date End Date Taking? Authorizing Provider  ondansetron (ZOFRAN-ODT) 4 MG disintegrating tablet Take 1 tablet (4 mg total) by mouth every 8 (eight) hours as needed for nausea or vomiting. Patient not taking: Reported on 01/08/2018 06/23/17   Aviva Kluver B, PA-C  pantoprazole (PROTONIX) 20 MG tablet Take 1 tablet (20 mg total) by mouth daily. Patient not taking: Reported on 01/08/2018 01/06/18 02/05/18  Jeannie Fend, PA-C  promethazine (PHENERGAN) 25 MG suppository Place 1 suppository (25 mg total) rectally every 6 (six) hours as needed for nausea or vomiting. 01/08/18   Kellie Shropshire, PA-C  promethazine (PHENERGAN) 25 MG tablet Take 1 tablet (25 mg total) by mouth every 6 (six) hours as needed for nausea or vomiting. Patient not taking: Reported on 01/08/2018 01/07/18   Anselm Pancoast, PA-C    Family History Family History  Problem Relation Age of Onset  . Heart disease Other     Social History Social History   Tobacco Use  . Smoking status: Never Smoker  . Smokeless tobacco: Never Used  Substance Use Topics  . Alcohol use: No  . Drug use: Not Currently    Frequency: 0.5 times per week    Types: Marijuana    Comment: last use 1 month ago     Allergies   Nsaids   Review of Systems Review of Systems  Constitutional: Negative for chills and fever.  Respiratory: Negative for shortness of breath.   Cardiovascular: Negative for chest pain.  Gastrointestinal: Positive for abdominal pain (epigastric), nausea and vomiting. Negative for blood in stool and diarrhea.  Genitourinary: Negative for difficulty urinating, dysuria and frequency.  Musculoskeletal: Negative for back pain and gait problem.  Skin: Negative for color change.  Neurological: Negative for syncope and light-headedness.  Psychiatric/Behavioral: Negative for agitation.  All other systems reviewed and are negative.    Physical Exam Updated Vital Signs BP 114/68 (BP Location:  Right Arm)   Pulse 86   Temp 98.6 F (37 C) (Oral)   Resp 16   Ht 5\' 8"  (1.727 m)   Wt 70.3 kg   SpO2 98%   BMI 23.57 kg/m   Physical Exam  Constitutional: He is oriented to person, place, and time. He appears well-developed and well-nourished. No distress.  Laying at bedside in no apparent distress, nontoxic-appearing.  HENT:  Head: Normocephalic and atraumatic.  Mucous memories moist.  Eyes: Pupils are equal, round, and reactive to light. Conjunctivae are normal. Right eye exhibits no discharge. Left eye exhibits no discharge.  Neck: Normal range of motion. Neck supple.  Cardiovascular: Normal rate, regular rhythm and intact distal pulses.  No murmur heard. Pulmonary/Chest: Effort normal and breath sounds normal. No stridor. No respiratory distress. He has no wheezes. He has no rales.  Abdominal:  Abdomen soft and nondistended.  No surgical scars.  Mildly tender to palpation in the epigastrium.  No guarding or rigidity.  No rebound tenderness.  Negative McBurney's point.  Negative Murphy sign.  Musculoskeletal: Normal range of motion.  Neurological: He is alert and oriented to person, place, and time. Coordination normal.  Skin: Skin is warm. Capillary refill takes less than 2 seconds. He is not diaphoretic.  Psychiatric: He has a normal mood and affect. His behavior is normal.  Nursing note and vitals reviewed.   ED Treatments / Results  Labs (all labs ordered are listed, but only abnormal results are displayed) Labs Reviewed  COMPREHENSIVE METABOLIC PANEL - Abnormal; Notable for the following components:      Result Value   Glucose, Bld 113 (*)    Albumin 5.3 (*)    Total Bilirubin 1.5 (*)    Anion gap 16 (*)    All other components within normal limits  LIPASE, BLOOD  CBC  URINALYSIS, ROUTINE W REFLEX MICROSCOPIC  I-STAT CG4 LACTIC ACID, ED    EKG None  Radiology No results found.  Procedures Procedures (including critical care time)  Medications Ordered  in ED Medications  promethazine (PHENERGAN) injection 25 mg (25 mg Intravenous Given 02/08/18 1829)  sodium chloride 0.9 % bolus 1,000 mL (0 mLs Intravenous Stopped 02/08/18 2000)  ondansetron (ZOFRAN) injection 4 mg (4 mg Intravenous Given 02/08/18 1823)  famotidine (PEPCID) IVPB 20 mg premix (0 mg Intravenous Stopped 02/08/18 1949)     Initial Impression / Assessment and Plan / ED Course  I have reviewed the triage vital signs and the nursing notes.  Pertinent labs & imaging results that were available during my care of the patient were reviewed by me and considered in my medical decision making (see chart for details).     Patient with a history of recurrent gastritis with associated nausea/vomiting presents with similar symptoms of epigastric pain and vomiting today.  Denies any new or worsening symptoms from his previous exacerbations.  Lab work reveals a small amount of ketones in the urine, likely related to his recent vomiting.  He also  has an anion gap acidosis of 16.  Lactic acid normal, although this was drawn after he received 1 L fluid bolus. Otherwise CBC unremarkable, no leukocytosis.  CMP without any major electrolyte abnormalities.  Lipase negative.  Patient given antiemetic and Pepcid.  He reports complete improvement of his symptoms and is tolerating p.o. fluids at the bedside.  I calculated his anion gap from his i-STAT Chem-8 after his fluid bolus which is 11.0, much improved from when he was initially seen.  Patient states he is ready to go home.  I do not think further work up is indicated given lab work and presentation of similar in the past.  Will discharge with Phenergan and daily omeprazole.  I have instructed him on the importance of follow-up with gastroenterology.  We discussed return precautions.  Patient agrees and appears reliable.  Final Clinical Impressions(s) / ED Diagnoses   Final diagnoses:  Chronic gastritis without bleeding, unspecified gastritis type     ED Discharge Orders         Ordered    promethazine (PHENERGAN) 25 MG suppository  Every 6 hours PRN     02/08/18 2109    omeprazole (PRILOSEC) 40 MG capsule  Daily,   Status:  Discontinued     02/08/18 2110    omeprazole (PRILOSEC) 40 MG capsule  Daily     02/08/18 2110           Kellie Shropshire, PA-C 02/09/18 0131    Mancel Bale, MD 02/09/18 [phone]

## 2018-02-08 NOTE — ED Provider Notes (Signed)
Patient placed in Quick Look pathway, seen and evaluated   Chief Complaint: n/v  HPI:   Steven Burch is a 22 y.o. male who presents to the ED with n/v and epigastric pain. Pt reports having a hx of stomach ulcers for 4 years. Pt reports vomiting x 3 days, unable to keep anything down. Pt reports mild abdominal pain, he notices when he is vomiting. Pt reports taking phenergan at home with no relief.   ROS: GI: nausea, vomiting, abdominal pain  Physical Exam:  BP 134/73 (BP Location: Right Arm)   Pulse (!) 54   Temp (!) 97.4 F (36.3 C) (Oral)   Resp 16   Ht 5\' 8"  (1.727 m)   Wt 70.3 kg   SpO2 100%   BMI 23.57 kg/m    Gen: No distress  Neuro: Awake and Alert  Skin: Warm and dry  Abdomen: vomiting in exam room, tender with palpation epigastric area   Initiation of care has begun. The patient has been counseled on the process, plan, and necessity for staying for the completion/evaluation, and the remainder of the medical screening examination    Janne Napoleoneese, Hope M, NP 02/08/18 1604    Terrilee FilesButler, Michael C, MD 02/09/18 [phone]

## 2018-02-26 ENCOUNTER — Encounter: Payer: Self-pay | Admitting: Gastroenterology

## 2018-02-26 ENCOUNTER — Ambulatory Visit (INDEPENDENT_AMBULATORY_CARE_PROVIDER_SITE_OTHER): Payer: Self-pay | Admitting: Adult Health

## 2018-02-26 ENCOUNTER — Encounter: Payer: Self-pay | Admitting: Adult Health

## 2018-02-26 VITALS — BP 110/66 | HR 75 | Temp 98.8°F | Ht 67.25 in | Wt 160.9 lb

## 2018-02-26 DIAGNOSIS — Z0000 Encounter for general adult medical examination without abnormal findings: Secondary | ICD-10-CM

## 2018-02-26 DIAGNOSIS — R112 Nausea with vomiting, unspecified: Secondary | ICD-10-CM | POA: Insufficient documentation

## 2018-02-26 DIAGNOSIS — K219 Gastro-esophageal reflux disease without esophagitis: Secondary | ICD-10-CM

## 2018-02-26 MED ORDER — PROMETHAZINE HCL 25 MG RE SUPP: 25.0000 mg | Freq: Four times a day (QID) | RECTAL | 0 refills | Status: DC | PRN

## 2018-02-26 MED ORDER — ONDANSETRON HCL 8 MG PO TABS: 8.0000 mg | ORAL_TABLET | Freq: Three times a day (TID) | ORAL | 0 refills | Status: DC | PRN

## 2018-02-26 NOTE — Assessment & Plan Note (Signed)
Refilled Ondansetron and Promethazine Referral to GI placed

## 2018-02-26 NOTE — Patient Instructions (Signed)
Mediterranean Diet A Mediterranean diet refers to food and lifestyle choices that are based on the traditions of countries located on the [place]. This way of eating has been shown to help prevent certain conditions and improve outcomes for people who have chronic diseases, like kidney disease and heart disease. What are tips for following this plan? Lifestyle  Cook and eat meals together with your family, when possible.  Drink enough fluid to keep your urine clear or pale yellow.  Be physically active every day. This includes: ? Aerobic exercise like running or swimming. ? Leisure activities like gardening, walking, or housework.  Get 7-8 hours of sleep each night.  If recommended by your health care provider, drink red wine in moderation. This means 1 glass a day for nonpregnant women and 2 glasses a day for men. A glass of wine equals 5 oz (150 mL). Reading food labels  Check the serving size of packaged foods. For foods such as rice and pasta, the serving size refers to the amount of cooked product, not dry.  Check the total fat in packaged foods. Avoid foods that have saturated fat or trans fats.  Check the ingredients list for added sugars, such as corn syrup. Shopping  At the grocery store, buy most of your food from the areas near the walls of the store. This includes: ? Fresh fruits and vegetables (produce). ? Grains, beans, nuts, and seeds. Some of these may be available in unpackaged forms or large amounts (in bulk). ? Fresh seafood. ? Poultry and eggs. ? Low-fat dairy products.  Buy whole ingredients instead of prepackaged foods.  Buy fresh fruits and vegetables in-season from local farmers markets.  Buy frozen fruits and vegetables in resealable bags.  If you do not have access to quality fresh seafood, buy precooked frozen shrimp or canned fish, such as tuna, salmon, or sardines.  Buy small amounts of raw or cooked vegetables, salads, or olives from the  deli or salad bar at your store.  Stock your pantry so you always have certain foods on hand, such as olive oil, canned tuna, canned tomatoes, rice, pasta, and beans. Cooking  Cook foods with extra-virgin olive oil instead of using butter or other vegetable oils.  Have meat as a side dish, and have vegetables or grains as your main dish. This means having meat in small portions or adding small amounts of meat to foods like pasta or stew.  Use beans or vegetables instead of meat in common dishes like chili or lasagna.  Experiment with different cooking methods. Try roasting or broiling vegetables instead of steaming or sauteing them.  Add frozen vegetables to soups, stews, pasta, or rice.  Add nuts or seeds for added healthy fat at each meal. You can add these to yogurt, salads, or vegetable dishes.  Marinate fish or vegetables using olive oil, lemon juice, garlic, and fresh herbs. Meal planning  Plan to eat 1 vegetarian meal one day each week. Try to work up to 2 vegetarian meals, if possible.  Eat seafood 2 or more times a week.  Have healthy snacks readily available, such as: ? Vegetable sticks with hummus. ? Greek yogurt. ? Fruit and nut trail mix.  Eat balanced meals throughout the week. This includes: ? Fruit: 2-3 servings a day ? Vegetables: 4-5 servings a day ? Low-fat dairy: 2 servings a day ? Fish, poultry, or lean meat: 1 serving a day ? Beans and legumes: 2 or more servings a week ? Nuts   and seeds: 1-2 servings a day ? Whole grains: 6-8 servings a day ? Extra-virgin olive oil: 3-4 servings a day  Limit red meat and sweets to only a few servings a month What are my food choices?  Mediterranean diet ? Recommended ? Grains: Whole-grain pasta. Brown rice. Bulgar wheat. Polenta. Couscous. Whole-wheat bread. Orpah Cobb. ? Vegetables: Artichokes. Beets. Broccoli. Cabbage. Carrots. Eggplant. Green beans. Chard. Kale. Spinach. Onions. Leeks. Peas. Squash.  Tomatoes. Peppers. Radishes. ? Fruits: Apples. Apricots. Avocado. Berries. Bananas. Cherries. Dates. Figs. Grapes. Lemons. Melon. Oranges. Peaches. Plums. Pomegranate. ? Meats and other protein foods: Beans. Almonds. Sunflower seeds. Pine nuts. Peanuts. Cod. Salmon. Scallops. Shrimp. Tuna. Tilapia. Clams. Oysters. Eggs. ? Dairy: Low-fat milk. Cheese. Greek yogurt. ? Beverages: Water. Red wine. Herbal tea. ? Fats and oils: Extra virgin olive oil. Avocado oil. Grape seed oil. ? Sweets and desserts: [place] yogurt with honey. Baked apples. Poached pears. Trail mix. ? Seasoning and other foods: Basil. Cilantro. Coriander. Cumin. Mint. Parsley. Sage. Rosemary. Tarragon. Garlic. Oregano. Thyme. Pepper. Balsalmic vinegar. Tahini. Hummus. Tomato sauce. Olives. Mushrooms. ? Limit these ? Grains: Prepackaged pasta or rice dishes. Prepackaged cereal with added sugar. ? Vegetables: Deep fried potatoes (french fries). ? Fruits: Fruit canned in syrup. ? Meats and other protein foods: Beef. Pork. Lamb. Poultry with skin. Hot dogs. Tomasa Blase. ? Dairy: Ice cream. Sour cream. Whole milk. ? Beverages: Juice. Sugar-sweetened soft drinks. Beer. Liquor and spirits. ? Fats and oils: Butter. Canola oil. Vegetable oil. Beef fat (tallow). Lard. ? Sweets and desserts: Cookies. Cakes. Pies. Candy. ? Seasoning and other foods: Mayonnaise. Premade sauces and marinades. ? The items listed may not be a complete list. Talk with your dietitian about what dietary choices are right for you. Summary  The Mediterranean diet includes both food and lifestyle choices.  Eat a variety of fresh fruits and vegetables, beans, nuts, seeds, and whole grains.  Limit the amount of red meat and sweets that you eat.  Talk with your health care provider about whether it is safe for you to drink red wine in moderation. This means 1 glass a day for nonpregnant women and 2 glasses a day for men. A glass of wine equals 5 oz (150 mL). This information  is not intended to replace advice given to you by your health care provider. Make sure you discuss any questions you have with your health care provider. Document Released: 01/21/2016 Document Revised: 02/23/2016 Document Reviewed: 01/21/2016 Elsevier Interactive Patient Education  2018 [employer].   Heartburn Heartburn is a type of pain or discomfort that can happen in the throat or chest. It is often described as a burning pain. It may also cause a bad taste in the mouth. Heartburn may feel worse when you lie down or bend over. It may be caused by stomach contents that move back up (reflux) into the tube that connects the mouth with the stomach (esophagus). Follow these instructions at home: Take these actions to lessen your discomfort and to help avoid problems. Diet  Follow a diet as told by your doctor. You may need to avoid foods and drinks such as: ? Coffee and tea (with or without caffeine). ? Drinks that contain alcohol. ? Energy drinks and sports drinks. ? Carbonated drinks or sodas. ? Chocolate and cocoa. ? Peppermint and mint flavorings. ? Garlic and onions. ? Horseradish. ? Spicy and acidic foods, such as peppers, chili powder, curry powder, vinegar, hot sauces, and BBQ sauce. ? Citrus fruit juices and citrus  fruits, such as oranges, lemons, and limes. ? Tomato-based foods, such as red sauce, chili, salsa, and pizza with red sauce. ? Fried and fatty foods, such as donuts, french fries, potato chips, and high-fat dressings. ? High-fat meats, such as hot dogs, rib eye steak, sausage, ham, and bacon. ? High-fat dairy items, such as whole milk, butter, and cream cheese.  Eat small meals often. Avoid eating large meals.  Avoid drinking large amounts of liquid with your meals.  Avoid eating meals during the 2-3 hours before bedtime.  Avoid lying down right after you eat.  Do not exercise right after you eat. General instructions  Pay attention to any changes in your  symptoms.  Take over-the-counter and prescription medicines only as told by your doctor. Do not take aspirin, ibuprofen, or other NSAIDs unless your doctor says it is okay.  Do not use any tobacco products, including cigarettes, chewing tobacco, and e-cigarettes. If you need help quitting, ask your doctor.  Wear loose clothes. Do not wear anything tight around your waist.  Raise (elevate) the head of your bed about 6 inches (15 cm).  Try to lower your stress. If you need help doing this, ask your doctor.  If you are overweight, lose an amount of weight that is healthy for you. Ask your doctor about a safe weight loss goal.  Keep all follow-up visits as told by your doctor. This is important. Contact a doctor if:  You have new symptoms.  You lose weight and you do not know why it is happening.  You have trouble swallowing, or it hurts to swallow.  You have wheezing or a cough that keeps happening.  Your symptoms do not get better with treatment.  You have heartburn often for more than two weeks. Get help right away if:  You have pain in your arms, neck, jaw, teeth, or back.  You feel sweaty, dizzy, or light-headed.  You have chest pain or shortness of breath.  You throw up (vomit) and your throw up looks like blood or coffee grounds.  Your poop (stool) is bloody or black. This information is not intended to replace advice given to you by your health care provider. Make sure you discuss any questions you have with your health care provider. Document Released: 02/09/2011 Document Revised: 11/05/2015 Document Reviewed: 09/24/2014 Elsevier Interactive Patient Education  2018 [employer]     Nausea, Adult Nausea is the feeling of an upset stomach or having to vomit. Nausea on its own is not usually a serious concern, but it may be an early sign of a more serious medical problem. As nausea gets worse, it can lead to vomiting. If vomiting develops, or if you are not able to  drink enough fluids, you are at risk of becoming dehydrated. Dehydration can make you tired and thirsty, cause you to have a dry mouth, and decrease how often you urinate. Older adults and people with other diseases or a weak immune system are at higher risk for dehydration. The main goals of treating your nausea are:  To limit repeated nausea episodes.  To prevent vomiting and dehydration.  Follow these instructions at home: Follow instructions from your health care provider about how to care for yourself at home. Eating and drinking Follow these recommendations as told by your health care provider:  Take an oral rehydration solution (ORS). This is a drink that is sold at pharmacies and retail stores.  Drink clear fluids in small amounts as you are able.  Clear fluids include water, ice chips, diluted fruit juice, and low-calorie sports drinks.  Eat bland, easy-to-digest foods in small amounts as you are able. These foods include bananas, applesauce, rice, lean meats, toast, and crackers.  Avoid drinking fluids that contain a lot of sugar or caffeine, such as energy drinks, sports drinks, and soda.  Avoid alcohol.  Avoid spicy or fatty foods.  General instructions  Drink enough fluid to keep your urine clear or pale yellow.  Wash your hands often. If soap and water are not available, use hand sanitizer.  Make sure that all people in your household wash their hands well and often.  Rest at home while you recover.  Take over-the-counter and prescription medicines only as told by your health care provider.  Breathe slowly and deeply when you feel nauseous.  Watch your condition for any changes.  Keep all follow-up visits as told by your health care provider. This is important. Contact a health care provider if:  You have a headache.  You have new symptoms.  Your nausea gets worse.  You have a fever.  You feel light-headed or dizzy.  You vomit.  You cannot keep  fluids down. Get help right away if:  You have pain in your chest, neck, arm, or jaw.  You feel extremely weak or you faint.  You have vomit that is bright red or looks like coffee grounds.  You have bloody or black stools or stools that look like tar.  You have a severe headache, a stiff neck, or both.  You have severe pain, cramping, or bloating in your abdomen.  You have a rash.  You have difficulty breathing or are breathing very quickly.  Your heart is beating very quickly.  Your skin feels cold and clammy.  You feel confused.  You have pain when you urinate.  You have signs of dehydration, such as: ? Dark urine, very little, or no urine. ? Cracked lips. ? Dry mouth. ? Sunken eyes. ? Sleepiness. ? Weakness. These symptoms may represent a serious problem that is an emergency. Do not wait to see if the symptoms will go away. Get medical help right away. Call your local emergency services (911 in the [place] Do not drive yourself to the hospital. This information is not intended to replace advice given to you by your health care provider. Make sure you discuss any questions you have with your health care provider. Document Released: 07/07/2004 Document Revised: 11/02/2015 Document Reviewed: 02/03/2015 Elsevier Interactive Patient Education  2018 [employer].  Continue all medications as directed. Remain well hydrated and follow Mediterranean diet. Continue regular exercise. Referral to Gastroenterologist placed.. Recommend complete physical in 2 months, schedule fasting lab appt about a week before physical. WELCOME TO THE THE PRACTICE!

## 2018-02-26 NOTE — Assessment & Plan Note (Signed)
Continue Omprazole 40mg  QD Avoid Spicy/Acidic foods. Referral to GI placed.

## 2018-02-26 NOTE — Progress Notes (Signed)
Subjective:    Patient ID: Steven Burch, male    DOB: [birth year], 22 y.o.   MRN: [id number]  HPI:  Steven Burch is here to establish as a new pt.  He is a pleasant 22 year old male. PMH: Chronic nausea and vomiting >4 years He reports N/V mat occur up to twice weekly and then he may go months without sx's. He has acid reflux >4 years, treated with dietary modifications and PPI- currently taking Omeprazole 40mg  QD He has not had a primary care provider, he has been using UC and EDto manage acute GI flares. He reports intermittent upper abdominal pain often r/t N/V He reports daily BMs, denies hematuria or hematochezia He denies constipation/diarrhea He has eliminated spicy/acidic foods and soda from diet. He estimates to drink >65 oz water/day and he exercises 4 days/week- cardio and weight training. He denies tobacco use and seldom drinks ETOH He is a Designer, jewellery.  Patient Care Team    Relationship Specialty Notifications Start End  William Hamburger D, NP PCP - General Family Medicine  02/26/18   Arsenio Loader [facility]    02/26/18 02/26/18    Patient Active Problem List   Diagnosis Date Noted  . Healthcare maintenance 02/26/2018  . Nausea and vomiting 02/26/2018  . Gastroesophageal reflux disease 02/26/2018     Past Medical History:  Diagnosis Date  . Multiple gastric ulcers      Past Surgical History:  Procedure Laterality Date  . ORTHOPEDIC SURGERY       Family History  Problem Relation Age of Onset  . Heart disease Other      Social History   Substance and Sexual Activity  Drug Use Not Currently  . Frequency: 0.5 times per week  . Types: Marijuana   Comment: last use 1 month ago     Social History   Substance and Sexual Activity  Alcohol Use No     Social History   Tobacco Use  Smoking Status Never Smoker  Smokeless Tobacco Never Used     Outpatient Encounter Medications as of 02/26/2018  Medication Sig  .  omeprazole (PRILOSEC) 40 MG capsule Take 1 capsule (40 mg total) by mouth daily.  . ondansetron (ZOFRAN) 8 MG tablet Take 1 tablet (8 mg total) by mouth every 8 (eight) hours as needed for nausea or vomiting.  . pantoprazole (PROTONIX) 20 MG tablet Take 1 tablet (20 mg total) by mouth daily. (Patient not taking: Reported on 01/08/2018)  . promethazine (PHENERGAN) 25 MG suppository Place 1 suppository (25 mg total) rectally every 6 (six) hours as needed for nausea or vomiting.  . [DISCONTINUED] ondansetron (ZOFRAN-ODT) 4 MG disintegrating tablet Take 1 tablet (4 mg total) by mouth every 8 (eight) hours as needed for nausea or vomiting. (Patient not taking: Reported on 01/08/2018)  . [DISCONTINUED] promethazine (PHENERGAN) 25 MG suppository Place 1 suppository (25 mg total) rectally every 6 (six) hours as needed for nausea or vomiting.   No facility-administered encounter medications on file as of 02/26/2018.     Allergies: Nsaids  Body mass index is 25.01 kg/m.  Blood pressure 110/66, pulse 75, temperature 98.8 F (37.1 C), temperature source Oral, height 5' 7.25" (1.708 m), weight 160 lb 14.4 oz (73 kg), SpO2 99 %.  Review of Systems  Constitutional: Positive for fatigue. Negative for activity change, appetite change, chills, diaphoresis, fever and unexpected weight change.  HENT: Negative for congestion.   Eyes: Negative for visual disturbance.  Respiratory: Negative for cough, chest tightness, shortness of breath, wheezing and stridor.   Cardiovascular: Negative for chest pain, palpitations and leg swelling.  Gastrointestinal: Positive for nausea and vomiting. Negative for abdominal distention, abdominal pain, blood in stool, constipation and diarrhea.  Endocrine: Negative for cold intolerance, heat intolerance, polydipsia, polyphagia and polyuria.  Genitourinary: Negative for difficulty urinating, flank pain and hematuria.  Musculoskeletal: Negative for arthralgias, back pain, gait  problem, joint swelling, myalgias, neck pain and neck stiffness.  Skin: Negative for color change, pallor, rash and wound.  Neurological: Negative for dizziness and headaches.  Hematological: Does not bruise/bleed easily.  Psychiatric/Behavioral: Negative for agitation, behavioral problems, confusion, decreased concentration, dysphoric mood, hallucinations, self-injury, sleep disturbance and suicidal ideas. The patient is not nervous/anxious and is not hyperactive.        Objective:   Physical Exam  Constitutional: He is oriented to person, place, and time. He appears well-developed and well-nourished. No distress.  HENT:  Head: Normocephalic and atraumatic.  Right Ear: External ear normal. No decreased hearing is noted.  Left Ear: External ear normal. No decreased hearing is noted.  Nose: Nose normal.  Mouth/Throat: Oropharynx is clear and moist.  Eyes: Pupils are equal, round, and reactive to light. Conjunctivae and EOM are normal.  Cardiovascular: Normal rate, regular rhythm, normal heart sounds and intact distal pulses.  No murmur heard. Pulmonary/Chest: Effort normal and breath sounds normal. No stridor. No respiratory distress. He has no wheezes. He has no rales. He exhibits no tenderness.  Neurological: He is alert and oriented to person, place, and time.  Skin: Skin is warm and dry. Capillary refill takes less than 2 seconds. No rash noted. He is not diaphoretic. No erythema. No pallor.  Psychiatric: He has a normal mood and affect. His behavior is normal. Judgment and thought content normal.  Nursing note and vitals reviewed.     Assessment & Plan:   1. Nausea and vomiting, intractability of vomiting not specified, unspecified vomiting type   2. Gastroesophageal reflux disease, esophagitis presence not specified   3. Healthcare maintenance     Healthcare maintenance Continue all medications as directed. Remain well hydrated and follow Mediterranean diet. Continue regular  exercise. Referral to Gastroenterologist placed.. Recommend complete physical in 2 months, schedule fasting lab appt about a week before physical.  Gastroesophageal reflux disease Continue Omprazole 40mg  QD Avoid Spicy/Acidic foods. Referral to GI placed.  Nausea and vomiting Refilled Ondansetron and Promethazine Referral to GI placed    FOLLOW-UP:  Return in about 2 months (around 04/28/2018) for CPE, Fasting Labs.

## 2018-02-26 NOTE — Assessment & Plan Note (Signed)
Continue all medications as directed. Remain well hydrated and follow Mediterranean diet. Continue regular exercise. Referral to Gastroenterologist placed.. Recommend complete physical in 2 months, schedule fasting lab appt about a week before physical.

## 2018-03-22 ENCOUNTER — Other Ambulatory Visit: Payer: Self-pay

## 2018-03-22 ENCOUNTER — Encounter (HOSPITAL_COMMUNITY): Payer: Self-pay | Admitting: *Deleted

## 2018-03-22 ENCOUNTER — Emergency Department (HOSPITAL_COMMUNITY)
Admission: EM | Admit: 2018-03-22 | Discharge: 2018-03-22 | Disposition: A | Discharge status: Home / Self Care | Payer: Medicaid Other | Attending: Emergency Medicine | Admitting: Emergency Medicine

## 2018-03-22 DIAGNOSIS — Z79899 Other long term (current) drug therapy: Secondary | ICD-10-CM | POA: Insufficient documentation

## 2018-03-22 DIAGNOSIS — R1013 Epigastric pain: Secondary | ICD-10-CM | POA: Insufficient documentation

## 2018-03-22 DIAGNOSIS — R112 Nausea with vomiting, unspecified: Secondary | ICD-10-CM | POA: Insufficient documentation

## 2018-03-22 LAB — URINALYSIS, ROUTINE W REFLEX MICROSCOPIC
Bacteria, UA: NONE SEEN
Bilirubin Urine: NEGATIVE
GLUCOSE, UA: NEGATIVE mg/dL
KETONES UR: 80 mg/dL — AB
LEUKOCYTES UA: NEGATIVE
Nitrite: NEGATIVE
PROTEIN: 100 mg/dL — AB
Specific Gravity, Urine: 1.035 — ABNORMAL HIGH (ref 1.005–1.030)
pH: 5 (ref 5.0–8.0)

## 2018-03-22 LAB — COMPREHENSIVE METABOLIC PANEL
ALT: 18 U/L (ref 0–44)
AST: 21 U/L (ref 15–41)
Albumin: 5.3 g/dL — ABNORMAL HIGH (ref 3.5–5.0)
Alkaline Phosphatase: 58 U/L (ref 38–126)
Anion gap: 16 — ABNORMAL HIGH (ref 5–15)
BILIRUBIN TOTAL: 1.1 mg/dL (ref 0.3–1.2)
BUN: 14 mg/dL (ref 6–20)
CO2: 26 mmol/L (ref 22–32)
CREATININE: 1.11 mg/dL (ref 0.61–1.24)
Calcium: 10.6 mg/dL — ABNORMAL HIGH (ref 8.9–10.3)
Chloride: 99 mmol/L (ref 98–111)
GFR calc Af Amer: >60 mL/min (ref >60)
Glucose, Bld: 127 mg/dL — ABNORMAL HIGH (ref 70–99)
Potassium: 3.9 mmol/L (ref 3.5–5.1)
Sodium: 141 mmol/L (ref 135–145)
TOTAL PROTEIN: 8.3 g/dL — AB (ref 6.5–8.1)

## 2018-03-22 LAB — CBC
HEMATOCRIT: 46.9 % (ref 39.0–52.0)
Hemoglobin: 15 g/dL (ref 13.0–17.0)
MCH: 28 pg (ref 26.0–34.0)
MCHC: 32 g/dL (ref 30.0–36.0)
MCV: 87.5 fL (ref 80.0–100.0)
Platelets: 297 10*3/uL (ref 150–400)
RBC: 5.36 MIL/uL (ref 4.22–5.81)
RDW: 13.4 % (ref 11.5–15.5)
WBC: 12.6 10*3/uL — AB (ref 4.0–10.5)
nRBC: 0 % (ref 0.0–0.2)

## 2018-03-22 LAB — LIPASE, BLOOD: Lipase: 21 U/L (ref 11–51)

## 2018-03-22 MED ORDER — PANTOPRAZOLE SODIUM 40 MG PO TBEC: 40.0000 mg | DELAYED_RELEASE_TABLET | Freq: Once | ORAL | Status: DC

## 2018-03-22 MED ORDER — SUCRALFATE 1 GM/10ML PO SUSP: 1.0000 g | Freq: Three times a day (TID) | ORAL | 0 refills | Status: DC

## 2018-03-22 MED ORDER — SODIUM CHLORIDE 0.9 % IV BOLUS
1000.0000 mL | Freq: Once | INTRAVENOUS | Status: AC
  Administered 2018-03-22: 1000 mL via INTRAVENOUS

## 2018-03-22 MED ORDER — GI COCKTAIL ~~LOC~~
30.0000 mL | Freq: Once | ORAL | Status: AC
  Administered 2018-03-22: 30 mL via ORAL
  Filled 2018-03-22: qty 30

## 2018-03-22 MED ORDER — PROMETHAZINE HCL 25 MG/ML IJ SOLN
25.0000 mg | Freq: Once | INTRAMUSCULAR | Status: AC
  Administered 2018-03-22: 25 mg via INTRAVENOUS
  Filled 2018-03-22: qty 1

## 2018-03-22 MED ORDER — ONDANSETRON HCL 4 MG/2ML IJ SOLN: 4.0000 mg | Freq: Once | INTRAMUSCULAR | Status: DC

## 2018-03-22 NOTE — ED Triage Notes (Signed)
thw pt is c/o abd pain with nausea and vomiting no diarrhea.  Last bm was 2 days ago

## 2018-03-22 NOTE — Discharge Instructions (Addendum)
Continue taking your promethazine as prescribed by your doctor.  Add carafate as it may help your stomach ulcers   Stay hydrated.   You need to see GI doctor as scheduled   Return to ER if you have worse abdominal pain, vomiting, fever.

## 2018-03-22 NOTE — ED Provider Notes (Signed)
[name] EMERGENCY DEPARTMENT Provider Note   CSN: [id number] Arrival date & time: 03/22/18  0645     History   Chief Complaint Chief Complaint  Patient presents with  . Abdominal Pain    HPI Steven Burch is a 22 y.o. male here presenting with vomiting.  Patient told me that he has a history of gastric reflux as well as stomach ulcers but never had an endoscopy done.  He has been vomiting for the last 2 to 3 days.  Denies any diarrhea.  Also has some epigastric pain but denies any fevers.  Patient states that he had previous ED visits for this before.  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  Patient has been using his Phenergan with no relief.  Denies any sick contacts or recent travel.  States that he is supposed to see GI doctor end of this month.  The history is provided by the patient.    Past Medical History:  Diagnosis Date  . History of stomach ulcers   . Multiple gastric ulcers     Patient Active Problem List   Diagnosis Date Noted  . Healthcare maintenance 02/26/2018  . Nausea and vomiting 02/26/2018  . Gastroesophageal reflux disease 02/26/2018    Past Surgical History:  Procedure Laterality Date  . ORTHOPEDIC SURGERY          Home Medications    Prior to Admission medications   Medication Sig Start Date End Date Taking? Authorizing Provider  omeprazole (PRILOSEC) 40 MG capsule Take 1 capsule (40 mg total) by mouth daily. 02/08/18   Kellie Shropshire, PA-C  ondansetron (ZOFRAN) 8 MG tablet Take 1 tablet (8 mg total) by mouth every 8 (eight) hours as needed for nausea or vomiting. 02/26/18   Danford, Orpha Bur D, NP  pantoprazole (PROTONIX) 20 MG tablet Take 1 tablet (20 mg total) by mouth daily. Patient not taking: Reported on 01/08/2018 01/06/18 02/05/18  Jeannie Fend, PA-C  promethazine (PHENERGAN) 25 MG suppository Place 1 suppository (25 mg total) rectally every 6 (six) hours as needed  for nausea or vomiting. 02/26/18   Danford, Jinny Blossom, NP    Family History Family History  Problem Relation Age of Onset  . Heart disease Other     Social History Social History   Tobacco Use  . Smoking status: Never Smoker  . Smokeless tobacco: Never Used  Substance Use Topics  . Alcohol use: No  . Drug use: Not Currently    Frequency: 0.5 times per week    Types: Marijuana    Comment: last use 1 month ago     Allergies   Nsaids   Review of Systems Review of Systems  Gastrointestinal: Positive for abdominal pain, nausea and vomiting.  All other systems reviewed and are negative.    Physical Exam Updated Vital Signs BP 133/73 (BP Location: Right Arm)   Pulse (!) 53   Temp 98.7 F (37.1 C) (Oral)   Resp 16   Ht 5\' 7"  (1.702 m)   Wt 72.6 kg   SpO2 99%   BMI 25.06 kg/m   Physical Exam  Constitutional: He is oriented to person, place, and time.  Vomiting, slightly uncomfortable   HENT:  Head: Normocephalic.  MM slightly dry   Eyes: Pupils are equal, round, and reactive to light. EOM are normal.  Cardiovascular: Normal rate, regular rhythm and normal heart sounds.  Pulmonary/Chest: Effort normal and breath sounds normal.  Abdominal: Normal appearance.  Minimal epigastric tenderness, no RUQ tenderness   Neurological: He is alert and oriented to person, place, and time.  Skin: Skin is warm. Capillary refill takes less than 2 seconds.  Psychiatric: He has a normal mood and affect. His behavior is normal.  Nursing note and vitals reviewed.    ED Treatments / Results  Labs (all labs ordered are listed, but only abnormal results are displayed) Labs Reviewed  COMPREHENSIVE METABOLIC PANEL - Abnormal; Notable for the following components:      Result Value   Glucose, Bld 127 (*)    Calcium 10.6 (*)    Total Protein 8.3 (*)    Albumin 5.3 (*)    Anion gap 16 (*)    All other components within normal limits  CBC - Abnormal; Notable for the following  components:   WBC 12.6 (*)    All other components within normal limits  URINALYSIS, ROUTINE W REFLEX MICROSCOPIC - Abnormal; Notable for the following components:   Color, Urine AMBER (*)    APPearance HAZY (*)    Specific Gravity, Urine 1.035 (*)    Hgb urine dipstick SMALL (*)    Ketones, ur 80 (*)    Protein, ur 100 (*)    All other components within normal limits  LIPASE, BLOOD    EKG None  Radiology No results found.  Procedures Procedures (including critical care time)  Medications Ordered in ED Medications  gi cocktail (Maalox,Lidocaine,Donnatal) (has no administration in time range)  sodium chloride 0.9 % bolus 1,000 mL (1,000 mLs Intravenous New Bag/Given 03/22/18 0826)  promethazine (PHENERGAN) injection 25 mg (25 mg Intravenous Given 03/22/18 0827)     Initial Impression / Assessment and Plan / ED Course  I have reviewed the triage vital signs and the nursing notes.  Pertinent labs & imaging results that were available during my care of the patient were reviewed by me and considered in my medical decision making (see chart for details).    Steven Burch is a 22 y.o. male here with vomiting, epigastric pain. Likely gastritis vs gastric ulcer vs cannabis hyperemesis. Will get labs, lipase, UA. Will give IVF, phenergan and reassess.   9:50 AM Felt better. No vomiting after phenergan. Labs and UA unremarkable. Has phenergan at home. Will add carafate. Told him to follow up with GI for endoscopy, further evaluation.    Final Clinical Impressions(s) / ED Diagnoses   Final diagnoses:  None    ED Discharge Orders    None       Charlynne Pander, MD 03/22/18 [phone]

## 2018-04-04 ENCOUNTER — Ambulatory Visit: Payer: Medicaid Other | Admitting: Gastroenterology

## 2018-04-17 ENCOUNTER — Encounter (HOSPITAL_COMMUNITY): Payer: Self-pay | Admitting: Emergency Medicine

## 2018-04-17 ENCOUNTER — Emergency Department (HOSPITAL_COMMUNITY)
Admission: EM | Admit: 2018-04-17 | Discharge: 2018-04-17 | Disposition: A | Discharge status: Home / Self Care | Payer: Medicaid Other | Attending: Emergency Medicine | Admitting: Emergency Medicine

## 2018-04-17 DIAGNOSIS — Z5321 Procedure and treatment not carried out due to patient leaving prior to being seen by health care provider: Secondary | ICD-10-CM | POA: Insufficient documentation

## 2018-04-17 DIAGNOSIS — R109 Unspecified abdominal pain: Secondary | ICD-10-CM | POA: Insufficient documentation

## 2018-04-17 LAB — LIPASE, BLOOD: Lipase: 24 U/L (ref 11–51)

## 2018-04-17 LAB — COMPREHENSIVE METABOLIC PANEL
ALBUMIN: 5.3 g/dL — AB (ref 3.5–5.0)
ALK PHOS: 57 U/L (ref 38–126)
ALT: 14 U/L (ref 0–44)
AST: 20 U/L (ref 15–41)
Anion gap: 15 (ref 5–15)
BILIRUBIN TOTAL: 1.6 mg/dL — AB (ref 0.3–1.2)
BUN: 15 mg/dL (ref 6–20)
CO2: 23 mmol/L (ref 22–32)
CREATININE: 1.03 mg/dL (ref 0.61–1.24)
Calcium: 10.3 mg/dL (ref 8.9–10.3)
Chloride: 101 mmol/L (ref 98–111)
GFR calc Af Amer: >60 mL/min (ref >60)
GLUCOSE: 103 mg/dL — AB (ref 70–99)
POTASSIUM: 3.3 mmol/L — AB (ref 3.5–5.1)
Sodium: 139 mmol/L (ref 135–145)
TOTAL PROTEIN: 8.1 g/dL (ref 6.5–8.1)

## 2018-04-17 LAB — CBC
HCT: 47.7 % (ref 39.0–52.0)
HEMOGLOBIN: 15.3 g/dL (ref 13.0–17.0)
MCH: 27.7 pg (ref 26.0–34.0)
MCHC: 32.1 g/dL (ref 30.0–36.0)
MCV: 86.3 fL (ref 80.0–100.0)
Platelets: 303 10*3/uL (ref 150–400)
RBC: 5.53 MIL/uL (ref 4.22–5.81)
RDW: 13.2 % (ref 11.5–15.5)
WBC: 9.9 10*3/uL (ref 4.0–10.5)
nRBC: 0 % (ref 0.0–0.2)

## 2018-04-17 MED ORDER — PROMETHAZINE HCL 25 MG/ML IJ SOLN
25.0000 mg | Freq: Once | INTRAMUSCULAR | Status: AC
  Administered 2018-04-17: 25 mg via INTRAMUSCULAR
  Filled 2018-04-17: qty 1

## 2018-04-17 NOTE — ED Notes (Signed)
Pt. Called for vital recheck, no answer X3

## 2018-04-17 NOTE — ED Provider Notes (Signed)
Patient placed in Quick Look pathway, seen and evaluated   Chief Complaint: n/v, abdominal pain  HPI: Steven Burch is a 22 y.o. male who presents to the ED with c/o n/v and epigastric pain that started 2 days ago and has gotten worse. Patient reports hx of gastric ulcer and reflux. He has not had an endoscopy. He is treated by his PCP  ROS: GI: n/v, abdominal pain  Physical Exam:  BP 133/82 (BP Location: Right Arm)   Pulse (!) 55   Temp (!) 97.5 F (36.4 C) (Oral)   Resp 18   SpO2 100%    Gen: No distress  Neuro: Awake and Alert  Skin: Warm and dry  Abdomen: soft, tender with palpation epigastric area. Patient vomiting in triage.   Initiation of care has begun. The patient has been counseled on the process, plan, and necessity for staying for the completion/evaluation, and the remainder of the medical screening examination    Janne Napoleon, NP 04/17/18 1948    Jacalyn Lefevre, MD 04/17/18 2019

## 2018-04-17 NOTE — ED Notes (Signed)
Called for vitals recheck x2, no response.

## 2018-04-17 NOTE — ED Triage Notes (Signed)
Pt presents with abd pain and vomiting x 5 years; dx stomach ulcers

## 2018-04-18 ENCOUNTER — Emergency Department (HOSPITAL_COMMUNITY)
Admission: EM | Admit: 2018-04-18 | Discharge: 2018-04-18 | Disposition: A | Discharge status: Home / Self Care | Payer: Medicaid Other | Attending: Emergency Medicine | Admitting: Emergency Medicine

## 2018-04-18 ENCOUNTER — Encounter (HOSPITAL_COMMUNITY): Payer: Self-pay | Admitting: Emergency Medicine

## 2018-04-18 DIAGNOSIS — R112 Nausea with vomiting, unspecified: Secondary | ICD-10-CM | POA: Insufficient documentation

## 2018-04-18 DIAGNOSIS — R1013 Epigastric pain: Secondary | ICD-10-CM | POA: Insufficient documentation

## 2018-04-18 MED ORDER — SODIUM CHLORIDE 0.9 % IV BOLUS
1000.0000 mL | Freq: Once | INTRAVENOUS | Status: AC
  Administered 2018-04-18: 1000 mL via INTRAVENOUS

## 2018-04-18 MED ORDER — LIDOCAINE VISCOUS HCL 2 % MT SOLN
15.0000 mL | Freq: Once | OROMUCOSAL | Status: AC
  Administered 2018-04-18: 15 mL via ORAL
  Filled 2018-04-18: qty 15

## 2018-04-18 MED ORDER — PROMETHAZINE HCL 25 MG PO TABS: 25.0000 mg | ORAL_TABLET | Freq: Four times a day (QID) | ORAL | 0 refills | Status: DC | PRN

## 2018-04-18 MED ORDER — OMEPRAZOLE 20 MG PO CPDR: 20.0000 mg | DELAYED_RELEASE_CAPSULE | Freq: Every day | ORAL | 0 refills | Status: DC

## 2018-04-18 MED ORDER — METOCLOPRAMIDE HCL 5 MG/ML IJ SOLN
10.0000 mg | Freq: Once | INTRAMUSCULAR | Status: AC
  Administered 2018-04-18: 10 mg via INTRAVENOUS
  Filled 2018-04-18: qty 2

## 2018-04-18 MED ORDER — FAMOTIDINE IN NACL 20-0.9 MG/50ML-% IV SOLN
20.0000 mg | Freq: Once | INTRAVENOUS | Status: AC
  Administered 2018-04-18: 20 mg via INTRAVENOUS
  Filled 2018-04-18: qty 50

## 2018-04-18 MED ORDER — ALUM & MAG HYDROXIDE-SIMETH 200-200-20 MG/5ML PO SUSP
30.0000 mL | Freq: Once | ORAL | Status: AC
  Administered 2018-04-18: 30 mL via ORAL
  Filled 2018-04-18: qty 30

## 2018-04-18 NOTE — ED Triage Notes (Signed)
Pt here POV c/o abd pain in upper middle, vomiting and diarrhea intermittently x 5 years.

## 2018-04-18 NOTE — ED Notes (Signed)
Blood work has been done on this patient .its in mini-lab waiting on orders to be place.

## 2018-04-18 NOTE — ED Provider Notes (Signed)
Emergency Department Provider Note   I have reviewed the triage vital signs and the nursing notes.   HISTORY  Chief Complaint No chief complaint on file.   HPI Steven Burch is a 22 y.o. male with PMH of chronic abdominal pain and vomiting resents to the emergency department for evaluation of worsening symptoms over the past several days.  The patient describes his pain is typical of his abdominal discomfort which is focused in the epigastric region.  He feels some discomfort in his throat and soreness at the tip of his tongue.  He is had multiple episodes of nonbloody emesis.  Denies fevers or chills.  No lower abdominal pain or diarrhea.  He states he is due to see a specialist later this month for endoscopy which is never been completed.  He does smoke marijuana daily.  Denies any other modifying factors. No radiation of symptoms.  Patient did present to the emergency department last night where he had a full set of lab work done but LWBS.   Past Medical History:  Diagnosis Date  . History of stomach ulcers   . Multiple gastric ulcers     Patient Active Problem List   Diagnosis Date Noted  . Healthcare maintenance 02/26/2018  . Nausea and vomiting 02/26/2018  . Gastroesophageal reflux disease 02/26/2018    Past Surgical History:  Procedure Laterality Date  . ORTHOPEDIC SURGERY      Allergies Nsaids  Family History  Problem Relation Age of Onset  . Heart disease Other     Social History Social History   Tobacco Use  . Smoking status: Never Smoker  . Smokeless tobacco: Never Used  Substance Use Topics  . Alcohol use: No  . Drug use: Not Currently    Frequency: 0.5 times per week    Types: Marijuana    Comment: last use 1 month ago    Review of Systems  Constitutional: No fever/chills Eyes: No visual changes. ENT: No sore throat. Cardiovascular: Denies chest pain. Respiratory: Denies shortness of breath. Gastrointestinal: Positive epigastric  abdominal pain. Positive nausea and vomiting.  No diarrhea.  No constipation. Genitourinary: Negative for dysuria. Musculoskeletal: Negative for back pain. Skin: Negative for rash. Neurological: Negative for headaches, focal weakness or numbness.  10-point ROS otherwise negative.  ____________________________________________   PHYSICAL EXAM:  VITAL SIGNS: ED Triage Vitals  Enc Vitals Group     BP 04/18/18 0701 128/67     Pulse Rate 04/18/18 0701 (!) 59     Resp 04/18/18 0701 17     Temp 04/18/18 0701 (!) 97.4 F (36.3 C)     Temp Source 04/18/18 0701 Oral     SpO2 04/18/18 0701 100 %     Weight 04/18/18 0701 160 lb (72.6 kg)     Height 04/18/18 0701 5\' 7"  (1.702 m)     Pain Score 04/18/18 0721 7   Constitutional: Alert and oriented. Well appearing and in no acute distress. Eyes: Conjunctivae are normal.  Head: Atraumatic. Nose: No congestion/rhinnorhea. Mouth/Throat: Mucous membranes are moist.  Oropharynx non-erythematous. Neck: No stridor.   Cardiovascular: Normal rate, regular rhythm. Good peripheral circulation. Grossly normal heart sounds.   Respiratory: Normal respiratory effort.  No retractions. Lungs CTAB. Gastrointestinal: Soft with mild epigastric tenderness to palpation. No rebound or guarding. No distention.  Musculoskeletal: No lower extremity tenderness nor edema. No gross deformities of extremities. Neurologic:  Normal speech and language. No gross focal neurologic deficits are appreciated.  Skin:  Skin is warm,  dry and intact. No rash noted.  ____________________________________________   LABS (all labs ordered are listed, but only abnormal results are displayed)  Labs from last night reviewed.  ____________________________________________  RADIOLOGY  None ____________________________________________   PROCEDURES  Procedure(s) performed:   Procedures  None ____________________________________________   INITIAL IMPRESSION / ASSESSMENT AND  PLAN / ED COURSE  Pertinent labs & imaging results that were available during my care of the patient were reviewed by me and considered in my medical decision making (see chart for details).  Patient presents to the emergency department for evaluation of worsening epigastric abdominal pain with nausea vomiting.  He has had intermittent symptoms for the last 5 years and smokes marijuana regularly.  He has been seen in the emergency department multiple times with similar symptoms.  Labs from 11/5 show mild bilirubin elevation with normal LFTs.  No white count.  No significant change in lipase.  The patient has a minimally tender abdomen to palpation.  His symptoms seem most consistent with gastritis and he has follow-up plan with gastroenterology later this month for likely endoscopy at that time.  Patient has had multiple CT scans of his abdomen with this presentation.  He had a right upper quadrant ultrasound in July 2019 which was normal.  I do not feel the patient needs repeat lab work today or imaging.  Plan for IV fluids and symptom management.  I did encourage him to abstain from smoking marijuana as this may be exacerbating or even causing his symptoms.  Patient feeling better after famotidine, IVF (2L), and Reglan. Patient feeling better but feels as if he gets better nausea relief from Phenergan at home. Will write refill of this PO. Patient has Phenergan suppositories at home. Plan for GI follow up. Also refilled Omeprazole.   At this time, I do not feel there is any life-threatening condition present. I have reviewed and discussed all results (EKG, imaging, lab, urine as appropriate), exam findings with patient. I have reviewed nursing notes and appropriate previous records.  I feel the patient is safe to be discharged home without further emergent workup. Discussed usual and customary return precautions. Patient and family (if present) verbalize understanding and are comfortable with this plan.   Patient will follow-up with their primary care provider. If they do not have a primary care provider, information for follow-up has been provided to them. All questions have been answered.  ____________________________________________  FINAL CLINICAL IMPRESSION(S) / ED DIAGNOSES  Final diagnoses:  Epigastric pain  Non-intractable vomiting with nausea, unspecified vomiting type     MEDICATIONS GIVEN DURING THIS VISIT:  Medications  sodium chloride 0.9 % bolus 1,000 mL (0 mLs Intravenous Stopped 04/18/18 0936)  alum & mag hydroxide-simeth (MAALOX/MYLANTA) 200-200-20 MG/5ML suspension 30 mL (30 mLs Oral Given 04/18/18 1133)    And  lidocaine (XYLOCAINE) 2 % viscous mouth solution 15 mL (15 mLs Oral Given 04/18/18 1134)  famotidine (PEPCID) IVPB 20 mg premix (0 mg Intravenous Stopped 04/18/18 0925)  metoCLOPramide (REGLAN) injection 10 mg (10 mg Intravenous Given 04/18/18 0807)  sodium chloride 0.9 % bolus 1,000 mL (0 mLs Intravenous Stopped 04/18/18 1130)     NEW OUTPATIENT MEDICATIONS STARTED DURING THIS VISIT:  Discharge Medication List as of 04/18/2018 11:17 AM    START taking these medications   Details  promethazine (PHENERGAN) 25 MG tablet Take 1 tablet (25 mg total) by mouth every 6 (six) hours as needed for nausea or vomiting., Starting Wed 04/18/2018, Normal  Note:  This document was prepared using Dragon voice recognition software and may include unintentional dictation errors.  Alona Bene, MD Emergency Medicine    Bambie Pizzolato, Arlyss Repress, MD 04/18/18 [phone]

## 2018-04-18 NOTE — Discharge Instructions (Signed)

## 2018-04-22 ENCOUNTER — Encounter (HOSPITAL_COMMUNITY): Payer: Self-pay

## 2018-04-22 ENCOUNTER — Emergency Department (HOSPITAL_COMMUNITY)
Admission: EM | Admit: 2018-04-22 | Discharge: 2018-04-22 | Disposition: A | Discharge status: Home / Self Care | Payer: Medicaid Other | Attending: Emergency Medicine | Admitting: Emergency Medicine

## 2018-04-22 ENCOUNTER — Other Ambulatory Visit: Payer: Self-pay

## 2018-04-22 DIAGNOSIS — K2970 Gastritis, unspecified, without bleeding: Secondary | ICD-10-CM

## 2018-04-22 DIAGNOSIS — Z79899 Other long term (current) drug therapy: Secondary | ICD-10-CM | POA: Insufficient documentation

## 2018-04-22 LAB — CBC WITH DIFFERENTIAL/PLATELET
ABS IMMATURE GRANULOCYTES: 0.03 10*3/uL (ref 0.00–0.07)
BASOS ABS: 0 10*3/uL (ref 0.0–0.1)
Basophils Relative: 0 %
EOS PCT: 1 %
Eosinophils Absolute: 0.1 10*3/uL (ref 0.0–0.5)
HEMATOCRIT: 51.5 % (ref 39.0–52.0)
HEMOGLOBIN: 16.9 g/dL (ref 13.0–17.0)
IMMATURE GRANULOCYTES: 0 %
LYMPHS ABS: 2.1 10*3/uL (ref 0.7–4.0)
LYMPHS PCT: 23 %
MCH: 28.4 pg (ref 26.0–34.0)
MCHC: 32.8 g/dL (ref 30.0–36.0)
MCV: 86.6 fL (ref 80.0–100.0)
Monocytes Absolute: 0.7 10*3/uL (ref 0.1–1.0)
Monocytes Relative: 7 %
NEUTROS ABS: 6.3 10*3/uL (ref 1.7–7.7)
NRBC: 0 % (ref 0.0–0.2)
Neutrophils Relative %: 69 %
Platelets: 275 10*3/uL (ref 150–400)
RBC: 5.95 MIL/uL — AB (ref 4.22–5.81)
RDW: 12.8 % (ref 11.5–15.5)
WBC: 9.2 10*3/uL (ref 4.0–10.5)

## 2018-04-22 LAB — COMPREHENSIVE METABOLIC PANEL
ALBUMIN: 4.6 g/dL (ref 3.5–5.0)
ALK PHOS: 46 U/L (ref 38–126)
ALT: 20 U/L (ref 0–44)
ANION GAP: 12 (ref 5–15)
AST: 31 U/L (ref 15–41)
BUN: 26 mg/dL — ABNORMAL HIGH (ref 6–20)
CALCIUM: 9.4 mg/dL (ref 8.9–10.3)
CO2: 27 mmol/L (ref 22–32)
Chloride: 98 mmol/L (ref 98–111)
Creatinine, Ser: 1.03 mg/dL (ref 0.61–1.24)
GFR calc non Af Amer: >60 mL/min (ref >60)
GLUCOSE: 120 mg/dL — AB (ref 70–99)
Potassium: 4.5 mmol/L (ref 3.5–5.1)
SODIUM: 137 mmol/L (ref 135–145)
Total Bilirubin: 2.3 mg/dL — ABNORMAL HIGH (ref 0.3–1.2)
Total Protein: 7.2 g/dL (ref 6.5–8.1)

## 2018-04-22 LAB — LIPASE, BLOOD: Lipase: 32 U/L (ref 11–51)

## 2018-04-22 MED ORDER — SODIUM CHLORIDE 0.9 % IV BOLUS
1000.0000 mL | Freq: Once | INTRAVENOUS | Status: AC
  Administered 2018-04-22: 1000 mL via INTRAVENOUS

## 2018-04-22 MED ORDER — FAMOTIDINE IN NACL 20-0.9 MG/50ML-% IV SOLN
20.0000 mg | Freq: Once | INTRAVENOUS | Status: AC
  Administered 2018-04-22: 20 mg via INTRAVENOUS
  Filled 2018-04-22: qty 50

## 2018-04-22 MED ORDER — ONDANSETRON HCL 4 MG/2ML IJ SOLN
4.0000 mg | Freq: Once | INTRAMUSCULAR | Status: AC
  Administered 2018-04-22: 4 mg via INTRAVENOUS
  Filled 2018-04-22: qty 2

## 2018-04-22 MED ORDER — METOCLOPRAMIDE HCL 5 MG/ML IJ SOLN
10.0000 mg | Freq: Once | INTRAMUSCULAR | Status: AC
  Administered 2018-04-22: 10 mg via INTRAVENOUS
  Filled 2018-04-22: qty 2

## 2018-04-22 NOTE — ED Triage Notes (Signed)
Pt states that he has had chronic stomach problems for about five years now. Pt complains of abdominal pain, nausea and vomiting. Pt states he has been seen earlier this week for the same symptoms. Pt states he is supposed to see a specialist later this month and believes it is stomach ulcers.

## 2018-04-22 NOTE — ED Provider Notes (Signed)
[facility] EMERGENCY DEPARTMENT Provider Note   CSN: [id number] Arrival date & time: 04/22/18  0744     History   Chief Complaint Chief Complaint  Patient presents with  . Abdominal Pain    stomach ulcers    HPI Steven Burch is a 22 y.o. male.  Patient is a 22 year old male who presents with nausea and vomiting.  He has a history of recurrent episodes of epigastric pain associated with nausea and vomiting.  It is felt to be related to peptic ulcer disease.  He has an upcoming appointment with [facility] gastroenterology on November 20.  He has been seen multiple times in the ED for similar symptoms.  He was last seen on November 6 and was discharged after being treated in the ED.  He states over the last 3 to 4 days he has had ongoing nausea and vomiting which is nonbloody and nonbilious.  He has some occasional discomfort in his upper abdomen although denies any currently.  No fevers.  No urinary symptoms.  No diarrhea.  He is been using his home medications without improvement in symptoms.     Past Medical History:  Diagnosis Date  . History of stomach ulcers   . Multiple gastric ulcers     Patient Active Problem List   Diagnosis Date Noted  . Healthcare maintenance 02/26/2018  . Nausea and vomiting 02/26/2018  . Gastroesophageal reflux disease 02/26/2018    Past Surgical History:  Procedure Laterality Date  . ORTHOPEDIC SURGERY          Home Medications    Prior to Admission medications   Medication Sig Start Date End Date Taking? Authorizing Provider  omeprazole (PRILOSEC) 20 MG capsule Take 1 capsule (20 mg total) by mouth daily. 04/18/18 05/18/18  Long, Arlyss Repress, MD  ondansetron (ZOFRAN) 8 MG tablet Take 1 tablet (8 mg total) by mouth every 8 (eight) hours as needed for nausea or vomiting. 02/26/18   Danford, Orpha Bur D, NP  pantoprazole (PROTONIX) 20 MG tablet Take 1 tablet (20 mg total) by mouth daily. Patient not taking: Reported on  01/08/2018 01/06/18 02/05/18  Jeannie Fend, PA-C  promethazine (PHENERGAN) 25 MG tablet Take 1 tablet (25 mg total) by mouth every 6 (six) hours as needed for nausea or vomiting. 04/18/18   Long, Arlyss Repress, MD  sucralfate (CARAFATE) 1 GM/10ML suspension Take 10 mLs (1 g total) by mouth 4 (four) times daily -  with meals and at bedtime. 03/22/18   Charlynne Pander, MD    Family History Family History  Problem Relation Age of Onset  . Heart disease Other     Social History Social History   Tobacco Use  . Smoking status: Never Smoker  . Smokeless tobacco: Never Used  Substance Use Topics  . Alcohol use: No  . Drug use: Not Currently    Frequency: 0.5 times per week    Types: Marijuana    Comment: last use 1 month ago     Allergies   Nsaids   Review of Systems Review of Systems  Constitutional: Negative for chills, diaphoresis, fatigue and fever.  HENT: Negative for congestion, rhinorrhea and sneezing.   Eyes: Negative.   Respiratory: Negative for cough, chest tightness and shortness of breath.   Cardiovascular: Negative for chest pain and leg swelling.  Gastrointestinal: Positive for abdominal pain, nausea and vomiting. Negative for blood in stool and diarrhea.  Genitourinary: Negative for difficulty urinating, flank pain, frequency and hematuria.  Musculoskeletal: Negative for arthralgias and back pain.  Skin: Negative for rash.  Neurological: Negative for dizziness, speech difficulty, weakness, numbness and headaches.     Physical Exam Updated Vital Signs BP 135/90 (BP Location: Right Arm)   Pulse 89   Temp 97.6 F (36.4 C) (Oral)   Ht 5\' 7"  (1.702 m)   Wt 72.5 kg   SpO2 95%   BMI 25.03 kg/m   Physical Exam  Constitutional: He is oriented to person, place, and time. He appears well-developed and well-nourished.  HENT:  Head: Normocephalic and atraumatic.  Eyes: Pupils are equal, round, and reactive to light.  Neck: Normal range of motion. Neck supple.    Cardiovascular: Normal rate, regular rhythm and normal heart sounds.  Pulmonary/Chest: Effort normal and breath sounds normal. No respiratory distress. He has no wheezes. He has no rales. He exhibits no tenderness.  Abdominal: Soft. Bowel sounds are normal. There is no tenderness. There is no rebound and no guarding.  Musculoskeletal: Normal range of motion. He exhibits no edema.  Lymphadenopathy:    He has no cervical adenopathy.  Neurological: He is alert and oriented to person, place, and time.  Skin: Skin is warm and dry. No rash noted.  Psychiatric: He has a normal mood and affect.     ED Treatments / Results  Labs (all labs ordered are listed, but only abnormal results are displayed) Labs Reviewed  COMPREHENSIVE METABOLIC PANEL - Abnormal; Notable for the following components:      Result Value   Glucose, Bld 120 (*)    BUN 26 (*)    Total Bilirubin 2.3 (*)    All other components within normal limits  CBC WITH DIFFERENTIAL/PLATELET - Abnormal; Notable for the following components:   RBC 5.95 (*)    All other components within normal limits  LIPASE, BLOOD    EKG None  Radiology No results found.  Procedures Procedures (including critical care time)  Medications Ordered in ED Medications  sodium chloride 0.9 % bolus 1,000 mL (0 mLs Intravenous Stopped 04/22/18 0912)  ondansetron (ZOFRAN) injection 4 mg (4 mg Intravenous Given 04/22/18 0812)  famotidine (PEPCID) IVPB 20 mg premix (0 mg Intravenous Stopped 04/22/18 0843)  metoCLOPramide (REGLAN) injection 10 mg (10 mg Intravenous Given 04/22/18 0918)     Initial Impression / Assessment and Plan / ED Course  I have reviewed the triage vital signs and the nursing notes.  Pertinent labs & imaging results that were available during my care of the patient were reviewed by me and considered in my medical decision making (see chart for details).     Patient is a 22 year old male who presents with vomiting.  He said  similar episodes over the last several years.  He has no abdominal tenderness on exam.  His labs are non-concerning.  He has no evidence of hepatitis or pancreatitis.  His bilirubin is mildly elevated but he has no pain over his gallbladder.  He has had a gallbladder ultrasound and a CT scan of his abdomen this year.  He has a follow-up appointment with [facility] gastroenterology in 10 days.  He has medications at home to use.  He states he recently just started the omeprazole.  He has Phenergan at home as well.  He denies any need for any further medications.  He was discharged home in good condition.  He is able to tolerate oral fluids without difficulty.  Return precautions were given.  Final Clinical Impressions(s) / ED Diagnoses   Final  diagnoses:  Gastritis without bleeding, unspecified chronicity, unspecified gastritis type    ED Discharge Orders    None       Rolan Bucco, MD 04/22/18 [phone]

## 2018-04-22 NOTE — ED Notes (Signed)
Patient verbalizes understanding of discharge instructions. Opportunity for questioning and answers were provided. Armband removed by staff, pt discharged from ED.  

## 2018-04-22 NOTE — ED Notes (Signed)
States he is not feeling as nauseated.

## 2018-05-02 ENCOUNTER — Ambulatory Visit: Payer: Self-pay | Admitting: Gastroenterology

## 2018-05-02 ENCOUNTER — Encounter: Payer: Self-pay | Admitting: Gastroenterology

## 2018-05-02 ENCOUNTER — Other Ambulatory Visit (INDEPENDENT_AMBULATORY_CARE_PROVIDER_SITE_OTHER): Payer: Self-pay

## 2018-05-02 VITALS — BP 100/60 | HR 82 | Ht 67.0 in | Wt 160.0 lb

## 2018-05-02 DIAGNOSIS — R112 Nausea with vomiting, unspecified: Secondary | ICD-10-CM

## 2018-05-02 LAB — HEPATIC FUNCTION PANEL
ALK PHOS: 51 U/L (ref 39–117)
ALT: 33 U/L (ref 0–53)
AST: 28 U/L (ref 0–37)
Albumin: 4.4 g/dL (ref 3.5–5.2)
Bilirubin, Direct: 0 mg/dL (ref 0.0–0.3)
TOTAL PROTEIN: 6.8 g/dL (ref 6.0–8.3)
Total Bilirubin: 0.3 mg/dL (ref 0.2–1.2)

## 2018-05-02 LAB — BILIRUBIN, FRACTIONATED(TOT/DIR/INDIR)
Bilirubin, Direct: 0.1 mg/dL (ref 0.0–0.2)
Indirect Bilirubin: 0.2 mg/dL (calc) (ref 0.2–1.2)
Total Bilirubin: 0.3 mg/dL (ref 0.2–1.2)

## 2018-05-02 NOTE — Progress Notes (Signed)
HPI: This is a very pleasant 22 year old man who was referred to me by William Hamburger D, NP  to evaluate intermittent nausea and vomiting.    Chief complaint is intermittent nausea and vomiting  Episodes of nauesa, vomting.  These can last 4-5 days at at time.  This has been going on at least four years. Can have 2-3 episodes per month.  Often ends up in ER for IV hydration, IV antiemetics.  Can have sharp pains while vomiting but never really has abdominal pain otherwise.  Overall stable weight  No NSAIDs.    Takes omeprazole during an episode but not in between.  Never had EGD.  Smokes THC once daily, does this for years since 89   Old Data Reviewed: Labs November 2019 showed normal CBC, normal lipase, normal complete metabolic profile except for total bilirubin 2.3  Ultrasound July 2019 was normal without gallstones.  CT scan with IV and oral contrast abdomen pelvis January 2019 was normal.  Review of systems: Pertinent positive and negative review of systems were noted in the above HPI section. All other review negative.   Past Medical History:  Diagnosis Date  . Multiple gastric ulcers     Past Surgical History:  Procedure Laterality Date  . NO PAST SURGERIES      Current Outpatient Medications  Medication Sig Dispense Refill  . omeprazole (PRILOSEC) 20 MG capsule Take 1 capsule (20 mg total) by mouth daily. (Patient taking differently: Take 20 mg by mouth. Takes for 14 days on and off) 30 capsule 0  . ondansetron (ZOFRAN) 8 MG tablet Take 1 tablet (8 mg total) by mouth every 8 (eight) hours as needed for nausea or vomiting. 30 tablet 0  . promethazine (PHENERGAN) 25 MG tablet Take 1 tablet (25 mg total) by mouth every 6 (six) hours as needed for nausea or vomiting. 30 tablet 0   No current facility-administered medications for this visit.     Allergies as of 05/02/2018 - Review Complete 05/02/2018  Allergen Reaction Noted  . Nsaids Other (See Comments)  03/13/2016    Family History  Problem Relation Age of Onset  . Diabetes Maternal Grandmother   . Diabetes Maternal Grandfather   . Heart attack Maternal Grandfather        mild  . Colon cancer Neg Hx   . Esophageal cancer Neg Hx   . Rectal cancer Neg Hx     Social History   Socioeconomic History  . Marital status: Single    Spouse name: Not on file  . Number of children: 0  . Years of education: Not on file  . Highest education level: Not on file  Occupational History  . Occupation: IT sales professional  Social Needs  . Financial resource strain: Not on file  . Food insecurity:    Worry: Not on file    Inability: Not on file  . Transportation needs:    Medical: Not on file    Non-medical: Not on file  Tobacco Use  . Smoking status: Never Smoker  . Smokeless tobacco: Never Used  Substance and Sexual Activity  . Alcohol use: No  . Drug use: Not Currently    Frequency: 0.5 times per week    Types: Marijuana    Comment: last use 1 month ago  . Sexual activity: Never  Lifestyle  . Physical activity:    Days per week: Not on file    Minutes per session: Not on file  . Stress: Not on file  Relationships  . Social connections:    Talks on phone: Not on file    Gets together: Not on file    Attends religious service: Not on file    Active member of club or organization: Not on file    Attends meetings of clubs or organizations: Not on file    Relationship status: Not on file  . Intimate partner violence:    Fear of current or ex partner: Not on file    Emotionally abused: Not on file    Physically abused: Not on file    Forced sexual activity: Not on file  Other Topics Concern  . Not on file  Social History Narrative  . Not on file     Physical Exam: BP 100/60   Pulse 82   Ht 5\' 7"  (1.702 m)   Wt 160 lb (72.6 kg)   BMI 25.06 kg/m  Constitutional: generally well-appearing Psychiatric: alert and oriented x3 Eyes: extraocular movements intact Mouth: oral  pharynx moist, no lesions Neck: supple no lymphadenopathy Cardiovascular: heart regular rate and rhythm Lungs: clear to auscultation bilaterally Abdomen: soft, nontender, nondistended, no obvious ascites, no peritoneal signs, normal bowel sounds Extremities: no lower extremity edema bilaterally Skin: no lesions on visible extremities   Assessment and plan: 22 y.o. male with intermittent episodes of nausea and vomiting  Chronic daily cannabis use.  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  I recommend he try to cut back.  I also recommend we try to exclude other potential causes such as peptic ulcer disease, gastritis, H. pylori.  We will proceed with an EGD at his soonest convenience.  He also has elevated total bilirubin and I will get a repeat set of labs today to check for gilbert's syndrome with fractionated bilirubin, LFTs.  Lastly he will begin taking omeprazole on a daily basis instead of just as needed.    Please see the "Patient Instructions" section for addition details about the plan.   Rob Buntinganiel , MD [facility] 05/02/2018, 10:45 AM  Cc: Julaine Fusianford, Katy D, NP

## 2018-05-02 NOTE — Patient Instructions (Addendum)
You will be set up for an upper endoscopy for nausea, vomiting.  You will have labs checked today in the basement lab.  Please head down after you check out with the front desk  (LFTS with fractionated bilirubin).  Start taking your omeprazole every day.  Try to stop smoking THC since we know it can cause similar symptoms as yours.  Please call our office to schedule your EGD.

## 2018-05-16 ENCOUNTER — Encounter: Payer: Self-pay | Admitting: Gastroenterology

## 2018-05-16 ENCOUNTER — Emergency Department (HOSPITAL_COMMUNITY)
Admission: EM | Admit: 2018-05-16 | Discharge: 2018-05-16 | Disposition: A | Discharge status: Home / Self Care | Payer: Medicaid Other | Attending: Emergency Medicine | Admitting: Emergency Medicine

## 2018-05-16 ENCOUNTER — Encounter (HOSPITAL_COMMUNITY): Payer: Self-pay | Admitting: *Deleted

## 2018-05-16 DIAGNOSIS — R112 Nausea with vomiting, unspecified: Secondary | ICD-10-CM | POA: Insufficient documentation

## 2018-05-16 LAB — CBC WITH DIFFERENTIAL/PLATELET
Abs Immature Granulocytes: 0.05 10*3/uL (ref 0.00–0.07)
BASOS PCT: 0 %
Basophils Absolute: 0 10*3/uL (ref 0.0–0.1)
Eosinophils Absolute: 0 10*3/uL (ref 0.0–0.5)
Eosinophils Relative: 0 %
HCT: 48.8 % (ref 39.0–52.0)
Hemoglobin: 15.3 g/dL (ref 13.0–17.0)
IMMATURE GRANULOCYTES: 0 %
Lymphocytes Relative: 12 %
Lymphs Abs: 1.4 10*3/uL (ref 0.7–4.0)
MCH: 27.6 pg (ref 26.0–34.0)
MCHC: 31.4 g/dL (ref 30.0–36.0)
MCV: 87.9 fL (ref 80.0–100.0)
MONOS PCT: 5 %
Monocytes Absolute: 0.6 10*3/uL (ref 0.1–1.0)
NEUTROS PCT: 83 %
Neutro Abs: 9.5 10*3/uL — ABNORMAL HIGH (ref 1.7–7.7)
PLATELETS: 312 10*3/uL (ref 150–400)
RBC: 5.55 MIL/uL (ref 4.22–5.81)
RDW: 13.3 % (ref 11.5–15.5)
WBC: 11.5 10*3/uL — AB (ref 4.0–10.5)
nRBC: 0 % (ref 0.0–0.2)

## 2018-05-16 LAB — COMPREHENSIVE METABOLIC PANEL
ALT: 18 U/L (ref 0–44)
AST: 25 U/L (ref 15–41)
Albumin: 5.1 g/dL — ABNORMAL HIGH (ref 3.5–5.0)
Alkaline Phosphatase: 56 U/L (ref 38–126)
Anion gap: 15 (ref 5–15)
BUN: 7 mg/dL (ref 6–20)
CHLORIDE: 103 mmol/L (ref 98–111)
CO2: 23 mmol/L (ref 22–32)
Calcium: 10.2 mg/dL (ref 8.9–10.3)
Creatinine, Ser: 1.01 mg/dL (ref 0.61–1.24)
GFR calc Af Amer: >60 mL/min (ref >60)
Glucose, Bld: 125 mg/dL — ABNORMAL HIGH (ref 70–99)
POTASSIUM: 3.6 mmol/L (ref 3.5–5.1)
Sodium: 141 mmol/L (ref 135–145)
Total Bilirubin: 1 mg/dL (ref 0.3–1.2)
Total Protein: 8 g/dL (ref 6.5–8.1)

## 2018-05-16 LAB — URINALYSIS, ROUTINE W REFLEX MICROSCOPIC
Bilirubin Urine: NEGATIVE
Glucose, UA: NEGATIVE mg/dL
Hgb urine dipstick: NEGATIVE
Ketones, ur: 5 mg/dL — AB
Leukocytes, UA: NEGATIVE
NITRITE: NEGATIVE
Protein, ur: NEGATIVE mg/dL
SPECIFIC GRAVITY, URINE: 1.017 (ref 1.005–1.030)
pH: 9 — ABNORMAL HIGH (ref 5.0–8.0)

## 2018-05-16 LAB — RAPID URINE DRUG SCREEN, HOSP PERFORMED
AMPHETAMINES: NOT DETECTED
BENZODIAZEPINES: NOT DETECTED
Barbiturates: NOT DETECTED
COCAINE: NOT DETECTED
OPIATES: NOT DETECTED
TETRAHYDROCANNABINOL: POSITIVE — AB

## 2018-05-16 LAB — LIPASE, BLOOD: Lipase: 26 U/L (ref 11–51)

## 2018-05-16 MED ORDER — DIPHENHYDRAMINE HCL 50 MG/ML IJ SOLN
25.0000 mg | Freq: Once | INTRAMUSCULAR | Status: AC
  Administered 2018-05-16: 25 mg via INTRAVENOUS
  Filled 2018-05-16: qty 1

## 2018-05-16 MED ORDER — METOCLOPRAMIDE HCL 5 MG/ML IJ SOLN
10.0000 mg | Freq: Once | INTRAMUSCULAR | Status: AC
  Administered 2018-05-16: 10 mg via INTRAVENOUS
  Filled 2018-05-16: qty 2

## 2018-05-16 MED ORDER — SODIUM CHLORIDE 0.9 % IV BOLUS (SEPSIS)
1000.0000 mL | Freq: Once | INTRAVENOUS | Status: AC
  Administered 2018-05-16: 1000 mL via INTRAVENOUS

## 2018-05-16 MED ORDER — LORAZEPAM 2 MG/ML IJ SOLN
1.0000 mg | Freq: Once | INTRAMUSCULAR | Status: AC
  Administered 2018-05-16: 1 mg via INTRAVENOUS
  Filled 2018-05-16: qty 1

## 2018-05-16 MED ORDER — METOCLOPRAMIDE HCL 10 MG PO TABS: 10.0000 mg | ORAL_TABLET | Freq: Four times a day (QID) | ORAL | 0 refills | Status: DC

## 2018-05-16 MED ORDER — ONDANSETRON HCL 4 MG/2ML IJ SOLN
4.0000 mg | Freq: Once | INTRAMUSCULAR | Status: AC
  Administered 2018-05-16: 4 mg via INTRAVENOUS
  Filled 2018-05-16: qty 2

## 2018-05-16 MED ORDER — SODIUM CHLORIDE 0.9 % IV SOLN
1000.0000 mL | INTRAVENOUS | Status: DC
  Administered 2018-05-16: 1000 mL via INTRAVENOUS

## 2018-05-16 NOTE — Discharge Instructions (Addendum)
Avoid Marijuana.  [facility]

## 2018-05-16 NOTE — ED Triage Notes (Signed)
Pt in for n/v for the last three days, symptoms have been intermittent for years, no distress noted

## 2018-05-16 NOTE — ED Provider Notes (Signed)
[facility] Note   CSN: [id number] Arrival date & time: 05/16/18  1254     History   Chief Complaint Chief Complaint  Patient presents with  . Emesis    HPI Lucas MallowChandler L Rutledge is a 22 y.o. male.  The history is provided by the patient. No language interpreter was used.  Emesis   This is a new problem. The current episode started 12 to 24 hours ago. The problem occurs 5 to 10 times per day. The problem has been gradually worsening. There has been no fever. Pertinent negatives include no fever.    Past Medical History:  Diagnosis Date  . Multiple gastric ulcers     Patient Active Problem List   Diagnosis Date Noted  . Healthcare maintenance 02/26/2018  . Nausea and vomiting 02/26/2018  . Gastroesophageal reflux disease 02/26/2018    Past Surgical History:  Procedure Laterality Date  . NO PAST SURGERIES          Home Medications    Prior to Admission medications   Medication Sig Start Date End Date Taking? Authorizing Provider  omeprazole (PRILOSEC) 20 MG capsule Take 1 capsule (20 mg total) by mouth daily. Patient taking differently: Take 20 mg by mouth. Takes for 14 days on and off 04/18/18 05/18/18  Long, Arlyss RepressJoshua G, MD  ondansetron (ZOFRAN) 8 MG tablet Take 1 tablet (8 mg total) by mouth every 8 (eight) hours as needed for nausea or vomiting. 02/26/18   Danford, Jinny BlossomKaty D, NP  promethazine (PHENERGAN) 25 MG tablet Take 1 tablet (25 mg total) by mouth every 6 (six) hours as needed for nausea or vomiting. 04/18/18   Long, Arlyss RepressJoshua G, MD    Family History Family History  Problem Relation Age of Onset  . Diabetes Maternal Grandmother   . Diabetes Maternal Grandfather   . Heart attack Maternal Grandfather        mild  . Colon cancer Neg Hx   . Esophageal cancer Neg Hx   . Rectal cancer Neg Hx     Social History Social History   Tobacco Use  . Smoking status: Never Smoker  . Smokeless tobacco: Never Used  Substance  Use Topics  . Alcohol use: No  . Drug use: Not Currently    Frequency: 0.5 times per week    Types: Marijuana    Comment: last use 1 month ago     Allergies   Nsaids   Review of Systems Review of Systems  Constitutional: Negative for fever.  Gastrointestinal: Positive for vomiting.  All other systems reviewed and are negative.    Physical Exam Updated Vital Signs BP 124/72   Pulse (!) 53   Resp 16   SpO2 100%   Physical Exam  Constitutional: He appears well-developed and well-nourished.  HENT:  Head: Normocephalic and atraumatic.  Right Ear: External ear normal.  Left Ear: External ear normal.  Eyes: Conjunctivae are normal.  Neck: Neck supple.  Cardiovascular: Normal rate and regular rhythm.  No murmur heard. Pulmonary/Chest: Effort normal and breath sounds normal. No respiratory distress.  Abdominal: Soft. There is no tenderness.  Musculoskeletal: He exhibits no edema.  Neurological: He is alert.  Skin: Skin is warm and dry.  Psychiatric: He has a normal mood and affect.  Nursing note and vitals reviewed.    ED Treatments / Results  Labs (all labs ordered are listed, but only abnormal results are displayed) Labs Reviewed  CBC WITH DIFFERENTIAL/PLATELET - Abnormal; Notable for  the following components:      Result Value   WBC 11.5 (*)    Neutro Abs 9.5 (*)    All other components within normal limits  COMPREHENSIVE METABOLIC PANEL - Abnormal; Notable for the following components:   Glucose, Bld 125 (*)    Albumin 5.1 (*)    All other components within normal limits  URINALYSIS, ROUTINE W REFLEX MICROSCOPIC - Abnormal; Notable for the following components:   pH 9.0 (*)    Ketones, ur 5 (*)    All other components within normal limits  RAPID URINE DRUG SCREEN, HOSP PERFORMED - Abnormal; Notable for the following components:   Tetrahydrocannabinol POSITIVE (*)    All other components within normal limits  LIPASE, BLOOD    EKG None  Radiology No  results found.  Procedures Procedures (including critical care time)  Medications Ordered in ED Medications  sodium chloride 0.9 % bolus 1,000 mL (0 mLs Intravenous Stopped 05/16/18 1654)    Followed by  sodium chloride 0.9 % bolus 1,000 mL (0 mLs Intravenous Stopped 05/16/18 1758)    Followed by  0.9 %  sodium chloride infusion (1,000 mLs Intravenous New Bag/Given 05/16/18 1810)  metoCLOPramide (REGLAN) injection 10 mg (10 mg Intravenous Given 05/16/18 1458)  diphenhydrAMINE (BENADRYL) injection 25 mg (25 mg Intravenous Given 05/16/18 1455)  ondansetron (ZOFRAN) injection 4 mg (4 mg Intravenous Given 05/16/18 1653)  LORazepam (ATIVAN) injection 1 mg (1 mg Intravenous Given 05/16/18 1810)     Initial Impression / Assessment and Plan / ED Course  I have reviewed the triage vital signs and the nursing notes.  Pertinent labs & imaging results that were available during my care of the patient were reviewed by me and considered in my medical decision making (see chart for details).     MDM  Pt given Iv fluids,  Reglan and benadryl provided no relief.  Pt given zofran with minimal relief.  Pt still complains of nausea.  Pt given ativan IV.  No further vomiting,   I advised pt I think THC is likely the cause of his problems.  Pt is advised to keep follow up appointment with GI doctor.   Final Clinical Impressions(s) / ED Diagnoses   Final diagnoses:  Non-intractable vomiting with nausea, unspecified vomiting type    ED Discharge Orders         Ordered    metoCLOPramide (REGLAN) 10 MG tablet  Every 6 hours     05/16/18 1951        An After Visit Summary was printed and given to the patient.    Osie Cheeks 05/16/18 Tillman Abide, MD 05/18/18 0040

## 2018-05-29 ENCOUNTER — Ambulatory Visit (AMBULATORY_SURGERY_CENTER): Payer: Self-pay | Admitting: *Deleted

## 2018-05-29 ENCOUNTER — Other Ambulatory Visit: Payer: Self-pay

## 2018-05-29 VITALS — Ht 66.0 in | Wt 155.6 lb

## 2018-05-29 DIAGNOSIS — R112 Nausea with vomiting, unspecified: Secondary | ICD-10-CM

## 2018-05-29 NOTE — Progress Notes (Signed)
Patient denies any allergies to egg or soy products. Patient denies complications with anesthesia/sedation.  Patient denies oxygen use at home and denies diet medications. Pamphlet given on Endoscopy.

## 2018-06-08 ENCOUNTER — Ambulatory Visit (AMBULATORY_SURGERY_CENTER): Payer: Medicaid Other | Admitting: Gastroenterology

## 2018-06-08 ENCOUNTER — Encounter: Payer: Self-pay | Admitting: Gastroenterology

## 2018-06-08 VITALS — BP 109/65 | HR 62 | Temp 98.7°F | Resp 11 | Ht 66.0 in | Wt 155.0 lb

## 2018-06-08 DIAGNOSIS — K209 Esophagitis, unspecified without bleeding: Secondary | ICD-10-CM

## 2018-06-08 DIAGNOSIS — K3189 Other diseases of stomach and duodenum: Secondary | ICD-10-CM

## 2018-06-08 DIAGNOSIS — R112 Nausea with vomiting, unspecified: Secondary | ICD-10-CM

## 2018-06-08 DIAGNOSIS — K2900 Acute gastritis without bleeding: Secondary | ICD-10-CM

## 2018-06-08 MED ORDER — SODIUM CHLORIDE 0.9 % IV SOLN: 500.0000 mL | Freq: Once | INTRAVENOUS | Status: DC

## 2018-06-08 MED ORDER — OMEPRAZOLE 40 MG PO CPDR: 40.0000 mg | DELAYED_RELEASE_CAPSULE | Freq: Every day | ORAL | 3 refills | Status: DC

## 2018-06-08 NOTE — Progress Notes (Signed)
A and O x3. Report to RN. Tolerated MAC anesthesia well.Teeth unchanged after procedure.

## 2018-06-08 NOTE — Progress Notes (Signed)
Pt's states no medical or surgical changes since previsit or office visit. 

## 2018-06-08 NOTE — Op Note (Signed)
[facility] Patient Name: Steven PlowmanChandler Burch Procedure Date: 06/08/2018 9:55 AM MRN: [id number] Endoscopist: Rachael Feeaniel P  , MD Age: 22 Referring MD:  Date of Birth: 08/13/1995 Gender: Male Account #: [id number] Procedure:                Upper GI endoscopy Indications:              Heartburn, Nausea with vomiting Medicines:                Monitored Anesthesia Care Procedure:                Pre-Anesthesia Assessment:                           - Prior to the procedure, a History and Physical                            was performed, and patient medications and                            allergies were reviewed. The patient's tolerance of                            previous anesthesia was also reviewed. The risks                            and benefits of the procedure and the sedation                            options and risks were discussed with the patient.                            All questions were answered, and informed consent                            was obtained. Prior Anticoagulants: The patient has                            taken no previous anticoagulant or antiplatelet                            agents. ASA Grade Assessment: II - A patient with                            mild systemic disease. After reviewing the risks                            and benefits, the patient was deemed in                            satisfactory condition to undergo the procedure.                           After obtaining informed consent, the endoscope was  passed under direct vision. Throughout the                            procedure, the patient's blood pressure, pulse, and                            oxygen saturations were monitored continuously. The                            Endoscope was introduced through the mouth, and                            advanced to the second part of duodenum. The upper                            GI endoscopy was  accomplished without difficulty.                            The patient tolerated the procedure well. Scope In: Scope Out: Findings:                 LA Grade A (one or more mucosal breaks less than 5                            mm, not extending between tops of 2 mucosal folds)                            esophagitis with no bleeding was found at the                            gastroesophageal junction.                           Mild inflammation characterized by erythema and                            granularity was found in the gastric antrum.                            Biopsies were taken with a cold forceps for                            histology.                           The exam was otherwise without abnormality. Complications:            No immediate complications. Estimated blood loss:                            None. Estimated Blood Loss:     Estimated blood loss: none. Impression:               - LA Grade A reflux esophagitis.                           -  Mild, non-specific gastritis. Biopsied.                           - The examination was otherwise normal. Recommendation:           - Patient has a contact number available for                            emergencies. The signs and symptoms of potential                            delayed complications were discussed with the                            patient. Return to normal activities tomorrow.                            Written discharge instructions were provided to the                            patient.                           - Resume previous diet.                           - Continue present medications.                           - Await pathology results. Rachael Feeaniel P , MD 06/08/2018 10:18:09 AM This report has been signed electronically.

## 2018-06-08 NOTE — Patient Instructions (Signed)
YOU HAD AN ENDOSCOPIC PROCEDURE TODAY AT THE [facility]:   Refer to the procedure report that was given to you for any specific questions about what was found during the examination.  If the procedure report does not answer your questions, please call your gastroenterologist to clarify.  If you requested that your care partner not be given the details of your procedure findings, then the procedure report has been included in a sealed envelope for you to review at your convenience later.  YOU SHOULD EXPECT: Some feelings of bloating in the abdomen. Passage of more gas than usual.  Walking can help get rid of the air that was put into your GI tract during the procedure and reduce the bloating. If you had a lower endoscopy (such as a colonoscopy or flexible sigmoidoscopy) you may notice spotting of blood in your stool or on the toilet paper. If you underwent a bowel prep for your procedure, you may not have a normal bowel movement for a few days.  Please Note:  You might notice some irritation and congestion in your nose or some drainage.  This is from the oxygen used during your procedure.  There is no need for concern and it should clear up in a day or so.  SYMPTOMS TO REPORT IMMEDIATELY:   Following upper endoscopy (EGD)  Vomiting of blood or coffee ground material  New chest pain or pain under the shoulder blades  Painful or persistently difficult swallowing  New shortness of breath  Fever of 100F or higher  Black, tarry-looking stools  For urgent or emergent issues, a gastroenterologist can be reached at any hour by calling [phone].   DIET:  We do recommend a small meal at first, but then you may proceed to your regular diet.  Drink plenty of fluids but you should avoid alcoholic beverages for 24 hours.  ACTIVITY:  You should plan to take it easy for the rest of today and you should NOT DRIVE or use heavy machinery until tomorrow (because of the sedation medicines used  during the test).    FOLLOW UP: Our staff will call the number listed on your records the next business day following your procedure to check on you and address any questions or concerns that you may have regarding the information given to you following your procedure. If we do not reach you, we will leave a message.  However, if you are feeling well and you are not experiencing any problems, there is no need to return our call.  We will assume that you have returned to your regular daily activities without incident.  If any biopsies were taken you will be contacted by phone or by letter within the next 1-3 weeks.  Please call us at [phone] if you have not heard about the biopsies in 3 weeks.    SIGNATURES/CONFIDENTIALITY: You and/or your care partner have signed paperwork which will be entered into your electronic medical record.  These signatures attest to the fact that that the information above on your After Visit Summary has been reviewed and is understood.  Full responsibility of the confidentiality of this discharge information lies with you and/or your care-partner. 

## 2018-06-08 NOTE — Progress Notes (Signed)
Called to room to assist during endoscopic procedure.  Patient ID and intended procedure confirmed with present staff. Received instructions for my participation in the procedure from the performing physician.  

## 2018-06-11 ENCOUNTER — Telehealth: Payer: Self-pay

## 2018-06-11 NOTE — Telephone Encounter (Signed)
  Follow up Call-  Call back number 06/08/2018  Post procedure Call Back phone  # [phone]  Permission to leave phone message Yes  Some recent data might be hidden     Patient questions:  Do you have a fever, pain , or abdominal swelling? No. Pain Score  0 *  Have you tolerated food without any problems? yes  Have you been able to return to your normal activities? Yes.    Do you have any questions about your discharge instructions: Diet   No. Medications  No. Follow up visit  No.  Do you have questions or concerns about your Care? No.  Actions: * If pain score is 4 or above: No action needed, pain <4.

## 2018-06-12 ENCOUNTER — Emergency Department (HOSPITAL_COMMUNITY)
Admission: EM | Admit: 2018-06-12 | Discharge: 2018-06-12 | Disposition: A | Discharge status: Home / Self Care | Payer: Medicaid Other | Attending: Emergency Medicine | Admitting: Emergency Medicine

## 2018-06-12 ENCOUNTER — Other Ambulatory Visit: Payer: Self-pay

## 2018-06-12 ENCOUNTER — Encounter (HOSPITAL_COMMUNITY): Payer: Self-pay | Admitting: Emergency Medicine

## 2018-06-12 DIAGNOSIS — R112 Nausea with vomiting, unspecified: Secondary | ICD-10-CM

## 2018-06-12 DIAGNOSIS — Z79899 Other long term (current) drug therapy: Secondary | ICD-10-CM | POA: Insufficient documentation

## 2018-06-12 MED ORDER — PROMETHAZINE HCL 25 MG PO TABS: 25.0000 mg | ORAL_TABLET | Freq: Four times a day (QID) | ORAL | 1 refills | Status: DC | PRN

## 2018-06-12 MED ORDER — SODIUM CHLORIDE 0.9 % IV BOLUS
1000.0000 mL | Freq: Once | INTRAVENOUS | Status: AC
  Administered 2018-06-12: 1000 mL via INTRAVENOUS

## 2018-06-12 MED ORDER — FAMOTIDINE IN NACL 20-0.9 MG/50ML-% IV SOLN
20.0000 mg | Freq: Once | INTRAVENOUS | Status: AC
  Administered 2018-06-12: 20 mg via INTRAVENOUS
  Filled 2018-06-12: qty 50

## 2018-06-12 MED ORDER — METOCLOPRAMIDE HCL 5 MG/ML IJ SOLN
10.0000 mg | Freq: Once | INTRAMUSCULAR | Status: AC
  Administered 2018-06-12: 10 mg via INTRAVENOUS
  Filled 2018-06-12: qty 2

## 2018-06-12 MED ORDER — PROMETHAZINE HCL 25 MG/ML IJ SOLN
25.0000 mg | Freq: Once | INTRAMUSCULAR | Status: AC
  Administered 2018-06-12: 25 mg via INTRAVENOUS
  Filled 2018-06-12: qty 1

## 2018-06-12 NOTE — ED Provider Notes (Signed)
[facility]   CSN: [id number] Arrival date & time: 06/12/18  [id number]    History   Chief Complaint Chief Complaint  Patient presents with  . Abdominal Pain  . Emesis  . Nausea    HPI Steven Burch is a 22 y.o. male.  HPI    22 year old male presents today with complaints of epigastric abdominal pain.  Patient notes a history of chronic gastritis with ulcers.  He notes that he is followed by gastroenterology in outpatient.  Most recently he was seen on Friday when he had an endoscopy performed.  He does not know the results of the endoscopy.  He notes Saturday he developed some minor epigastric pain typical of previous with burning and vomiting.  Patient notes worsening pain today worse with vomiting, he denies any pain with palpation of his abdomen, denies any fever diarrhea or any other infectious symptoms.  Patient notes symptoms are typical of previous but he ran out of his promethazine.  Any alcohol use denies any aspirin or NSAIDs.  He notes he occasionally smokes marijuana.    Past Medical History:  Diagnosis Date  . Multiple gastric ulcers     Patient Active Problem List   Diagnosis Date Noted  . Healthcare maintenance 02/26/2018  . Nausea and vomiting 02/26/2018  . Gastroesophageal reflux disease 02/26/2018    Past Surgical History:  Procedure Laterality Date  . WRIST SURGERY Right    as a child        Home Medications    Prior to Admission medications   Medication Sig Start Date End Date Taking? Authorizing Provider  omeprazole (PRILOSEC) 20 MG capsule Take 1 capsule (20 mg total) by mouth daily. 04/18/18 06/12/18 Yes Steven Burch  metoCLOPramide (REGLAN) 10 MG tablet Take 1 tablet (10 mg total) by mouth every 6 (six) hours. Patient not taking: Reported on 05/29/2018 05/16/18   Elson AreasSofia, Steven Burch  omeprazole (PRILOSEC) 40 MG capsule Take 1 capsule (40 mg total) by mouth daily. Patient not  taking: Reported on 06/12/2018 06/08/18   Steven Burch  ondansetron (ZOFRAN) 8 MG tablet Take 1 tablet (8 mg total) by mouth every 8 (eight) hours as needed for nausea or vomiting. Patient not taking: Reported on 05/29/2018 02/26/18   Steven Burch  promethazine (PHENERGAN) 25 MG tablet Take 1 tablet (25 mg total) by mouth every 6 (six) hours as needed for nausea or vomiting. 06/12/18   Steven Burch    Family History Family History  Problem Relation Age of Onset  . Diabetes Maternal Grandmother   . Diabetes Maternal Grandfather   . Heart attack Maternal Grandfather        mild  . Colon cancer Neg Hx   . Esophageal cancer Neg Hx   . Rectal cancer Neg Hx   . Stomach cancer Neg Hx     Social History Social History   Tobacco Use  . Smoking status: Never Smoker  . Smokeless tobacco: Never Used  Substance Use Topics  . Alcohol use: No  . Drug use: Yes    Types: Marijuana    Comment: 06/07/18     Allergies   Nsaids   Review of Systems Review of Systems  All other systems reviewed and are negative.  Physical Exam Updated Vital Signs BP (!) 113/56 (BP Location: Right Arm)   Pulse 62   Temp 98 F (36.7 C) (Axillary)   Resp 18  SpO2 100%   Physical Exam Vitals signs and nursing note reviewed.  Constitutional:      Appearance: He is well-developed.  HENT:     Head: Normocephalic and atraumatic.  Eyes:     General: No scleral icterus.       Right eye: No discharge.        Left eye: No discharge.     Conjunctiva/sclera: Conjunctivae normal.     Pupils: Pupils are equal, round, and reactive to light.  Neck:     Musculoskeletal: Normal range of motion.     Vascular: No JVD.     Trachea: No tracheal deviation.  Pulmonary:     Effort: Pulmonary effort is normal.     Breath sounds: No stridor.  Abdominal:     Comments: Abdomen soft nontender to palpation, no masses rebound or guarding  Neurological:     Mental Status: He is alert and  oriented to person, place, and time.     Coordination: Coordination normal.  Psychiatric:        Behavior: Behavior normal.        Thought Content: Thought content normal.        Judgment: Judgment normal.      ED Treatments / Results  Labs (all labs ordered are listed, but only abnormal results are displayed) Labs Reviewed - No data to display  EKG None  Radiology No results found.  Procedures Procedures (including critical care time)  Medications Ordered in ED Medications  sodium chloride 0.9 % bolus 1,000 mL (0 mLs Intravenous Stopped 06/12/18 1107)  metoCLOPramide (REGLAN) injection 10 mg (10 mg Intravenous Given 06/12/18 1012)  famotidine (PEPCID) IVPB 20 mg premix (0 mg Intravenous Stopped 06/12/18 1107)  promethazine (PHENERGAN) injection 25 mg (25 mg Intravenous Given 06/12/18 1140)     Initial Impression / Assessment and Plan / ED Course  I have reviewed the triage vital signs and the nursing notes.  Pertinent labs & imaging results that were available during my care of the patient were reviewed by me and considered in my medical decision making (see chart for details).    22 year old male presents today with complaints of nausea and vomiting.  This is typical of patient's previous presentations uncertain etiology although high suspicion for gastritis.  He was given fluids antiemetics here which significantly improved his symptoms.  His abdomen is soft and nontender.  He did have a procedure performed on Friday, I have very low suspicion for any complications secondary to his procedure.  Patient is very well-appearing tolerating p.o.  His labs were lost in our system, attempted to redraw labs patient would like to leave at this time.  I do find this reasonable as he is back to his baseline with no signs of systemic illness.  Patient will be discharged with strict return precautions continued outpatient follow-up.  He verbalized understanding and agreement to today's  plan had no further questions or concerns.  Final Clinical Impressions(s) / ED Diagnoses   Final diagnoses:  Non-intractable vomiting with nausea, unspecified vomiting type    ED Discharge Orders         Ordered    promethazine (PHENERGAN) 25 MG tablet  Every 6 hours PRN     06/12/18 1223           Steven MechanicHedges, Shonteria Abeln, Burch 06/12/18 1427    Tegeler, Canary Brimhristopher J, Burch 06/12/18 [phone]

## 2018-06-12 NOTE — Discharge Instructions (Addendum)
Please read attached information. If you experience any new or worsening signs or symptoms please return to the emergency room for evaluation. Please follow-up with your primary care provider or specialist as discussed. Please use medication prescribed only as directed and discontinue taking if you have any concerning signs or symptoms.   °

## 2018-06-12 NOTE — ED Notes (Signed)
Pt. Stated, he is still having stomach pain and some nausea.

## 2018-06-12 NOTE — ED Triage Notes (Signed)
Pt. Stated, Steven Burch had stomach problems for 5 years and I finally had an endoscopy done on Friday . I do have a primary care Dr.

## 2018-06-15 ENCOUNTER — Telehealth: Payer: Self-pay | Admitting: Adult Health

## 2018-06-15 ENCOUNTER — Emergency Department (HOSPITAL_COMMUNITY)
Admission: EM | Admit: 2018-06-15 | Discharge: 2018-06-15 | Disposition: A | Discharge status: Home / Self Care | Payer: Self-pay | Attending: Emergency Medicine | Admitting: Emergency Medicine

## 2018-06-15 ENCOUNTER — Encounter (HOSPITAL_COMMUNITY): Payer: Self-pay | Admitting: *Deleted

## 2018-06-15 DIAGNOSIS — R1084 Generalized abdominal pain: Secondary | ICD-10-CM | POA: Insufficient documentation

## 2018-06-15 DIAGNOSIS — R112 Nausea with vomiting, unspecified: Secondary | ICD-10-CM | POA: Insufficient documentation

## 2018-06-15 DIAGNOSIS — E876 Hypokalemia: Secondary | ICD-10-CM | POA: Insufficient documentation

## 2018-06-15 DIAGNOSIS — F1290 Cannabis use, unspecified, uncomplicated: Secondary | ICD-10-CM | POA: Insufficient documentation

## 2018-06-15 DIAGNOSIS — Z79899 Other long term (current) drug therapy: Secondary | ICD-10-CM | POA: Insufficient documentation

## 2018-06-15 LAB — COMPREHENSIVE METABOLIC PANEL
ALT: 19 U/L (ref 0–44)
AST: 18 U/L (ref 15–41)
Albumin: 5 g/dL (ref 3.5–5.0)
Alkaline Phosphatase: 48 U/L (ref 38–126)
Anion gap: 12 (ref 5–15)
BILIRUBIN TOTAL: 1.9 mg/dL — AB (ref 0.3–1.2)
BUN: 23 mg/dL — ABNORMAL HIGH (ref 6–20)
CALCIUM: 10 mg/dL (ref 8.9–10.3)
CO2: 27 mmol/L (ref 22–32)
Chloride: 99 mmol/L (ref 98–111)
Creatinine, Ser: 1.08 mg/dL (ref 0.61–1.24)
GFR calc Af Amer: >60 mL/min (ref >60)
GFR calc non Af Amer: >60 mL/min (ref >60)
Glucose, Bld: 110 mg/dL — ABNORMAL HIGH (ref 70–99)
Potassium: 3 mmol/L — ABNORMAL LOW (ref 3.5–5.1)
Sodium: 138 mmol/L (ref 135–145)
Total Protein: 8 g/dL (ref 6.5–8.1)

## 2018-06-15 LAB — CBC
HCT: 48 % (ref 39.0–52.0)
Hemoglobin: 15.8 g/dL (ref 13.0–17.0)
MCH: 28 pg (ref 26.0–34.0)
MCHC: 32.9 g/dL (ref 30.0–36.0)
MCV: 85 fL (ref 80.0–100.0)
Platelets: 269 10*3/uL (ref 150–400)
RBC: 5.65 MIL/uL (ref 4.22–5.81)
RDW: 12.9 % (ref 11.5–15.5)
WBC: 8.9 10*3/uL (ref 4.0–10.5)
nRBC: 0 % (ref 0.0–0.2)

## 2018-06-15 LAB — LIPASE, BLOOD: Lipase: 22 U/L (ref 11–51)

## 2018-06-15 MED ORDER — METOCLOPRAMIDE HCL 5 MG/ML IJ SOLN: 10.0000 mg | Freq: Once | INTRAMUSCULAR | Status: DC

## 2018-06-15 MED ORDER — POTASSIUM CHLORIDE CRYS ER 20 MEQ PO TBCR
40.0000 meq | EXTENDED_RELEASE_TABLET | Freq: Once | ORAL | Status: AC
  Administered 2018-06-15: 40 meq via ORAL
  Filled 2018-06-15: qty 4

## 2018-06-15 MED ORDER — HALOPERIDOL LACTATE 5 MG/ML IJ SOLN
2.0000 mg | Freq: Once | INTRAMUSCULAR | Status: AC
  Administered 2018-06-15: 2 mg via INTRAVENOUS
  Filled 2018-06-15: qty 1

## 2018-06-15 MED ORDER — HALOPERIDOL LACTATE 5 MG/ML IJ SOLN: 2.0000 mg | Freq: Once | INTRAMUSCULAR | Status: DC

## 2018-06-15 MED ORDER — PROMETHAZINE HCL 25 MG/ML IJ SOLN
25.0000 mg | Freq: Once | INTRAMUSCULAR | Status: AC
  Administered 2018-06-15: 25 mg via INTRAVENOUS
  Filled 2018-06-15: qty 1

## 2018-06-15 MED ORDER — SODIUM CHLORIDE 0.9 % IV BOLUS
1000.0000 mL | Freq: Once | INTRAVENOUS | Status: AC
  Administered 2018-06-15: 1000 mL via INTRAVENOUS

## 2018-06-15 NOTE — ED Notes (Signed)
Patient verbalized understanding of discharge instructions and denies any further needs or questions at this time. VS stable. Patient ambulatory with steady gait.  

## 2018-06-15 NOTE — ED Provider Notes (Signed)
[facility] EMERGENCY DEPARTMENT Provider Note   CSN: [id number] Arrival date & time: 06/15/18  0930     History   Chief Complaint Chief Complaint  Patient presents with  . Emesis    HPI Steven Burch is a 23 y.o. male with a past medical history of gastric ulcers, cyclic vomiting, marijuana use, GERD, who presents today for evaluation of nausea vomiting and generalized abdominal pain.  He reports that he had an endoscopy last week and since then he has been unable to keep anything down.  He was seen in the ER on 12/31 for similar symptoms.  He reports that since last week he has not used any marijuana.  He reports generally feeling unwell.  No Chest pain or shortness of breath.  This is the same abdominal pain and feeling he gets when he has a flair of symptoms.   He tells me that he was recently diagnosed with having "too much acid" in his stomach and that when he spits into a bag he can "hear the acid fizzing."  HPI  Past Medical History:  Diagnosis Date  . Multiple gastric ulcers     Patient Active Problem List   Diagnosis Date Noted  . Healthcare maintenance 02/26/2018  . Nausea and vomiting 02/26/2018  . Gastroesophageal reflux disease 02/26/2018    Past Surgical History:  Procedure Laterality Date  . WRIST SURGERY Right    as a child        Home Medications    Prior to Admission medications   Medication Sig Start Date End Date Taking? Authorizing Provider  metoCLOPramide (REGLAN) 10 MG tablet Take 1 tablet (10 mg total) by mouth every 6 (six) hours. Patient not taking: Reported on 05/29/2018 05/16/18   Elson Areas, PA-C  omeprazole (PRILOSEC) 20 MG capsule Take 1 capsule (20 mg total) by mouth daily. 04/18/18 06/12/18  Long, Arlyss Repress, MD  omeprazole (PRILOSEC) 40 MG capsule Take 1 capsule (40 mg total) by mouth daily. Patient not taking: Reported on 06/12/2018 06/08/18   Rachael Fee, MD  ondansetron (ZOFRAN) 8 MG tablet Take 1  tablet (8 mg total) by mouth every 8 (eight) hours as needed for nausea or vomiting. Patient not taking: Reported on 05/29/2018 02/26/18   Julaine Fusi, NP  promethazine (PHENERGAN) 25 MG tablet Take 1 tablet (25 mg total) by mouth every 6 (six) hours as needed for nausea or vomiting. 06/12/18   Eyvonne Mechanic, PA-C    Family History Family History  Problem Relation Age of Onset  . Diabetes Maternal Grandmother   . Diabetes Maternal Grandfather   . Heart attack Maternal Grandfather        mild  . Colon cancer Neg Hx   . Esophageal cancer Neg Hx   . Rectal cancer Neg Hx   . Stomach cancer Neg Hx     Social History Social History   Tobacco Use  . Smoking status: Never Smoker  . Smokeless tobacco: Never Used  Substance Use Topics  . Alcohol use: No  . Drug use: Yes    Types: Marijuana    Comment: 06/07/18     Allergies   Nsaids   Review of Systems Review of Systems  Constitutional: Negative for chills and fever.  Respiratory: Negative for shortness of breath.   Gastrointestinal: Positive for abdominal pain, nausea and vomiting. Negative for diarrhea.  Genitourinary: Negative for dysuria and urgency.  All other systems reviewed and are negative.  Physical Exam Updated Vital Signs BP 118/68 (BP Location: Right Arm)   Pulse 75   Temp 98.1 F (36.7 C) (Axillary)   Resp 16   SpO2 100%   Physical Exam Vitals signs and nursing note reviewed.  Constitutional:      General: He is not in acute distress.    Appearance: He is well-developed and normal weight.  HENT:     Head: Normocephalic and atraumatic.     Mouth/Throat:     Mouth: Mucous membranes are moist.  Eyes:     Conjunctiva/sclera: Conjunctivae normal.  Neck:     Musculoskeletal: Normal range of motion and neck supple.  Cardiovascular:     Rate and Rhythm: Normal rate and regular rhythm.     Pulses: Normal pulses.     Heart sounds: Normal heart sounds. No murmur.  Pulmonary:     Effort:  Pulmonary effort is normal. No respiratory distress.     Breath sounds: Normal breath sounds.  Abdominal:     General: Abdomen is flat. Bowel sounds are normal. There is no distension.     Palpations: Abdomen is soft.     Tenderness: There is no abdominal tenderness.  Musculoskeletal:     Right lower leg: No edema.     Left lower leg: No edema.  Skin:    General: Skin is warm and dry.  Neurological:     General: No focal deficit present.     Mental Status: He is alert.  Psychiatric:        Mood and Affect: Mood normal.        Behavior: Behavior normal.      ED Treatments / Results  Labs (all labs ordered are listed, but only abnormal results are displayed) Labs Reviewed  COMPREHENSIVE METABOLIC PANEL - Abnormal; Notable for the following components:      Result Value   Potassium 3.0 (*)    Glucose, Bld 110 (*)    BUN 23 (*)    Total Bilirubin 1.9 (*)    All other components within normal limits  LIPASE, BLOOD  CBC    EKG EKG Interpretation  Date/Time:  Friday June 15 2018 11:30:16 EST Ventricular Rate:  65 PR Interval:  150 QRS Duration: 96 QT Interval:  420 QTC Calculation: 436 R Axis:   98 Text Interpretation:  Unusual P axis, possible ectopic atrial rhythm Rightward axis Pulmonary disease pattern Nonspecific T wave abnormality Abnormal ECG diffuse t wave flattening and inversion compared with prior 12/15 Confirmed by Meridee ScoreButler, Michael [phone]([id number]) on 06/15/2018 11:38:25 AM   Radiology No results found.  Procedures Procedures (including critical care time)  Medications Ordered in ED Medications  sodium chloride 0.9 % bolus 1,000 mL (0 mLs Intravenous Stopped 06/15/18 1212)  potassium chloride SA (K-DUR,KLOR-CON) CR tablet 40 mEq (40 mEq Oral Given 06/15/18 1158)  haloperidol lactate (HALDOL) injection 2 mg (2 mg Intravenous Given 06/15/18 1153)  promethazine (PHENERGAN) injection 25 mg (25 mg Intravenous Given 06/15/18 1153)     Initial Impression / Assessment and  Plan / ED Course  I have reviewed the triage vital signs and the nursing notes.  Pertinent labs & imaging results that were available during my care of the patient were reviewed by me and considered in my medical decision making (see chart for details).  Clinical Course as of Jun 16 1327  Fri Jun 15, 2018  1313 Patient reports that he is feeling "100%" better.  He reports that he has all the meds he  needs at home.    [EH]    Clinical Course User Index [EH] Cristina Gong, PA-C   Patient presents today for evaluation of recurrent abdominal pain nausea and vomiting.  Today his symptoms are like his previous episodes.  His primary concern is that he has not been able to keep anything down for 3 days.  His abdominal pain feels like his usual abdominal pain when he has a flare.  Abdomen is soft, nontender nondistended on my exam.  Labs are obtained and reviewed, bilirubin is slightly elevated, however he has had this in the past with similar symptoms.  Potassium is slightly low at 3.0.  He is given p.o. potassium replacement, suspect that this is secondary to GI loss and poor p.o. intake.  Given multiple QT prolonging medications including use of Phenergan at home EKG was obtained showing a normal QTC, suspect that other changes are related to mild hypokalemia, as he has no CP or ShOB.  He is given 1 L IV fluids, Phenergan, and 2 mg of Haldol after which he reported feeling much better.  He was able to pass p.o. challenge without difficulty.  Discussed with patient suspect this is cannabinoid hyperemesis syndrome.  Discussed the need to completely abstain from all marijuana products.    Return precautions were discussed with patient who states their understanding.  At the time of discharge patient denied any unaddressed complaints or concerns.  Patient is agreeable for discharge home.   Final Clinical Impressions(s) / ED Diagnoses   Final diagnoses:  Non-intractable vomiting with nausea,  unspecified vomiting type  Generalized abdominal pain  Marijuana use  Hypokalemia due to excessive gastrointestinal loss of potassium    ED Discharge Orders    None       Norman Clay 06/15/18 1333    Tilden Fossa, MD 06/18/18 [phone]

## 2018-06-15 NOTE — Discharge Instructions (Addendum)
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  This is a syndrome seen in people who use marijuana. It is associated with nausea, vomiting, abdominal pain, and generally not feeling well.  Often times patients will report that their symptoms can be relieved with a warm shower.  The best way to prevent this is to completely stop smoking/using all forms of marijuana.  Some people find symptom relief from applying capsaicin cream to their abdomen (belly).  Please be advised that capsaicin cream may cause a burning feeling.  Capsaicin cream may relieve some chest symptoms, however it will not prevent or fully resolve this condition.   Today you received medications that may make you sleepy or impair your ability to make decisions.  For the next 24 hours please do not drive, operate heavy machinery, care for a small child with out another adult present, or perform any activities that may cause harm to you or someone else if you were to fall asleep or be impaired.   Your potassium today was low.  This is most likely due to vomiting.  Please make sure that you are eating foods high in potassium.

## 2018-06-15 NOTE — ED Triage Notes (Signed)
Pt in c/o continued n/v, seen for same recently, also recently saw a specialist for this, states he is not able to keep anything down

## 2018-06-15 NOTE — Telephone Encounter (Signed)
Patient called w/ complaints of abd & GI issues --pt was advised to see GI specialist after provider reviewed his medical char. ---- Pt had Endo w/ [facility] 06/08/2018 advised pt to contact then & advise of his continued symptoms/condition & seek appt with them. --Pt understood & somewhat agreeable. --glh

## 2018-06-15 NOTE — ED Notes (Signed)
Patient provided with Sprite and crackers - tolerated well.

## 2018-06-18 ENCOUNTER — Encounter: Payer: Self-pay | Admitting: Gastroenterology

## 2018-10-08 ENCOUNTER — Ambulatory Visit (HOSPITAL_COMMUNITY)
Admission: EM | Admit: 2018-10-08 | Discharge: 2018-10-08 | Disposition: A | Discharge status: Home / Self Care | Payer: Self-pay | Attending: Family Medicine | Admitting: Family Medicine

## 2018-10-08 ENCOUNTER — Ambulatory Visit (INDEPENDENT_AMBULATORY_CARE_PROVIDER_SITE_OTHER): Payer: Self-pay

## 2018-10-08 ENCOUNTER — Encounter (HOSPITAL_COMMUNITY): Payer: Self-pay

## 2018-10-08 ENCOUNTER — Other Ambulatory Visit: Payer: Self-pay

## 2018-10-08 DIAGNOSIS — B9789 Other viral agents as the cause of diseases classified elsewhere: Secondary | ICD-10-CM

## 2018-10-08 DIAGNOSIS — J069 Acute upper respiratory infection, unspecified: Secondary | ICD-10-CM

## 2018-10-08 MED ORDER — BENZONATATE 100 MG PO CAPS: 200.0000 mg | ORAL_CAPSULE | Freq: Three times a day (TID) | ORAL | 0 refills | Status: DC | PRN

## 2018-10-08 MED ORDER — IPRATROPIUM BROMIDE 0.06 % NA SOLN: 2.0000 | Freq: Four times a day (QID) | NASAL | 12 refills | Status: DC

## 2018-10-08 NOTE — ED Provider Notes (Signed)
[facility]    CSN: [id number] Arrival date & time: 10/08/18  1610     History   Chief Complaint Chief Complaint  Patient presents with  . Cough    HPI Steven Burch is a 23 y.o. male.   Steven Burch presents with complaints of cough, headache. Worse at night, keeps him up. Not as bad in the day. 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 No fevers. Productive cough. No shortness of breath . Mild sore throat. No ear pain. No chest pain. Nasal drainage. No medications for symptoms. Hx of stomach issues, medications affect this so he limits taking anything. No known ill contacts. Started 1 week ago. Symptoms worsening. No asthma. Smokes marijuana a couple times a month.    ROS per HPI, negative if not otherwise mentioned.      Past Medical History:  Diagnosis Date  . Multiple gastric ulcers     Patient Active Problem List   Diagnosis Date Noted  . Healthcare maintenance 02/26/2018  . Nausea and vomiting 02/26/2018  . Gastroesophageal reflux disease 02/26/2018    Past Surgical History:  Procedure Laterality Date  . WRIST SURGERY Right    as a child       Home Medications    Prior to Admission medications   Medication Sig Start Date End Date Taking? Authorizing Provider  benzonatate (TESSALON) 100 MG capsule Take 2 capsules (200 mg total) by mouth 3 (three) times daily as needed for cough. 10/08/18   Linus Mako B, NP  ipratropium (ATROVENT) 0.06 % nasal spray Place 2 sprays into both nostrils 4 (four) times daily. 10/08/18   Georgetta Haber, NP  metoCLOPramide (REGLAN) 10 MG tablet Take 1 tablet (10 mg total) by mouth every 6 (six) hours. Patient not taking: Reported on 05/29/2018 05/16/18   Elson Areas, PA-C  omeprazole (PRILOSEC) 20 MG capsule Take 1 capsule (20 mg total) by mouth daily. 04/18/18 06/12/18  Long, Arlyss Repress, MD  omeprazole (PRILOSEC) 40 MG capsule Take 1 capsule (40 mg total) by mouth daily. Patient not taking: Reported on 06/12/2018  06/08/18   Rachael Fee, MD  ondansetron (ZOFRAN) 8 MG tablet Take 1 tablet (8 mg total) by mouth every 8 (eight) hours as needed for nausea or vomiting. Patient not taking: Reported on 05/29/2018 02/26/18   Julaine Fusi, NP  promethazine (PHENERGAN) 25 MG tablet Take 1 tablet (25 mg total) by mouth every 6 (six) hours as needed for nausea or vomiting. 06/12/18   Eyvonne Mechanic, PA-C    Family History Family History  Problem Relation Age of Onset  . Diabetes Maternal Grandmother   . Diabetes Maternal Grandfather   . Heart attack Maternal Grandfather        mild  . Colon cancer Neg Hx   . Esophageal cancer Neg Hx   . Rectal cancer Neg Hx   . Stomach cancer Neg Hx     Social History Social History   Tobacco Use  . Smoking status: Never Smoker  . Smokeless tobacco: Never Used  Substance Use Topics  . Alcohol use: No  . Drug use: Yes    Types: Marijuana    Comment: 06/07/18     Allergies   Nsaids   Review of Systems Review of Systems   Physical Exam Triage Vital Signs ED Triage Vitals [10/08/18 1626]  Enc Vitals Group     BP (!) 120/59     Pulse Rate (!) 102     Resp 18  Temp 98.7 F (37.1 C)     Temp Source Oral     SpO2 98 %     Weight      Height      Head Circumference      Peak Flow      Pain Score 7     Pain Loc      Pain Edu?      Excl. in GC?    No data found.  Updated Vital Signs BP (!) 120/59 (BP Location: Right Arm)   Pulse (!) 102   Temp 98.7 F (37.1 C) (Oral)   Resp 18   SpO2 98%    Physical Exam Vitals signs reviewed.  Constitutional:      Appearance: He is well-developed.  HENT:     Head: Normocephalic and atraumatic.     Right Ear: Tympanic membrane, ear canal and external ear normal.     Left Ear: Tympanic membrane, ear canal and external ear normal.     Nose: Nose normal.     Right Sinus: No maxillary sinus tenderness or frontal sinus tenderness.     Left Sinus: No maxillary sinus tenderness or frontal sinus  tenderness.     Mouth/Throat:     Pharynx: Uvula midline.  Eyes:     Conjunctiva/sclera: Conjunctivae normal.     Pupils: Pupils are equal, round, and reactive to light.  Neck:     Musculoskeletal: Normal range of motion.  Cardiovascular:     Rate and Rhythm: Normal rate and regular rhythm.  Pulmonary:     Effort: Pulmonary effort is normal.     Breath sounds: Normal breath sounds. No wheezing.  Lymphadenopathy:     Cervical: No cervical adenopathy.  Skin:    General: Skin is warm and dry.  Neurological:     Mental Status: He is alert and oriented to person, place, and time.      UC Treatments / Results  Labs (all labs ordered are listed, but only abnormal results are displayed) Labs Reviewed - No data to display  EKG None  Radiology Dg Chest 2 View  Result Date: 10/08/2018 CLINICAL DATA:  Productive cough for 2 weeks. EXAM: CHEST - 2 VIEW COMPARISON:  03/13/2016 FINDINGS: The heart size and mediastinal contours are within normal limits. Both lungs are clear. The visualized skeletal structures are unremarkable. IMPRESSION: Negative.  No active cardiopulmonary disease. Electronically Signed   By: Myles RosenthalJohn  Stahl M.D.   On: 10/08/2018 18:10    Procedures Procedures (including critical care time)  Medications Ordered in UC Medications - No data to display  Initial Impression / Assessment and Plan / UC Course  I have reviewed the triage vital signs and the nursing notes.  Pertinent labs & imaging results that were available during my care of the patient were reviewed by me and considered in my medical decision making (see chart for details).     Non toxic in appearance, afebrile. No increased work of breathing, tachypnea or hypoxia. Lungs clear and chest xray without acute findings. Does work in the public so patient concerned about potential covid exposure. Symptoms for 7 days without fever at this time. Precautions discussed. Supportive cares recommended. Return  precautions provided. Patient verbalized understanding and agreeable to plan.   Final Clinical Impressions(s) / UC Diagnoses   Final diagnoses:  Viral URI with cough     Discharge Instructions     Chest xray is clear today without acute findings.  Push fluids to ensure adequate hydration  and keep secretions thin.  May use tessalon as needed for cough.  Nasal spray, especially before bedtime may be helpful with night time cough in preventing post nasal drip.  Current Covid-19 guidelines recommend out of work for 7 days from onset of symptoms or once fever free for 3 days without medications whichever is longer.  I would speak to your management about current out of work recommendations, we do not have covid testing capabilities here.  Decrease to quit smoking as this can worsen symptoms.  If develop worsening of symptoms, fever, shortness of breath , chest pain , or otherwise worsening please go to the ER.      ED Prescriptions    Medication Sig Dispense Auth. Provider   ipratropium (ATROVENT) 0.06 % nasal spray Place 2 sprays into both nostrils 4 (four) times daily. 15 mL Linus Mako B, NP   benzonatate (TESSALON) 100 MG capsule Take 2 capsules (200 mg total) by mouth 3 (three) times daily as needed for cough. 21 capsule Georgetta Haber, NP     Controlled Substance Prescriptions [place] Controlled Substance Registry consulted? Not Applicable   Georgetta Haber, NP 10/08/18 1819

## 2018-10-08 NOTE — ED Notes (Signed)
Patient verbalizes understanding of discharge instructions. Opportunity for questioning and answers were provided. Patient discharged from [facility] by RN.  

## 2018-10-08 NOTE — ED Triage Notes (Signed)
Pt cc cough and headachefor a week now. Pt states the cough is worst at night.

## 2018-10-08 NOTE — Discharge Instructions (Addendum)
Chest xray is clear today without acute findings.  Push fluids to ensure adequate hydration and keep secretions thin.  May use tessalon as needed for cough.  Nasal spray, especially before bedtime may be helpful with night time cough in preventing post nasal drip.  Current Covid-19 guidelines recommend out of work for 7 days from onset of symptoms or once fever free for 3 days without medications whichever is longer.  I would speak to your management about current out of work recommendations, we do not have covid testing capabilities here.  Decrease to quit smoking as this can worsen symptoms.  If develop worsening of symptoms, fever, shortness of breath , chest pain , or otherwise worsening please go to the ER.

## 2018-10-16 ENCOUNTER — Encounter (HOSPITAL_COMMUNITY): Payer: Self-pay

## 2018-10-16 ENCOUNTER — Other Ambulatory Visit: Payer: Self-pay

## 2018-10-16 ENCOUNTER — Ambulatory Visit (HOSPITAL_COMMUNITY)
Admission: EM | Admit: 2018-10-16 | Discharge: 2018-10-16 | Disposition: A | Discharge status: Home / Self Care | Payer: Medicaid Other | Attending: Family Medicine | Admitting: Family Medicine

## 2018-10-16 DIAGNOSIS — S8991XA Unspecified injury of right lower leg, initial encounter: Secondary | ICD-10-CM

## 2018-10-16 DIAGNOSIS — S81811A Laceration without foreign body, right lower leg, initial encounter: Secondary | ICD-10-CM

## 2018-10-16 NOTE — ED Triage Notes (Signed)
Pt states he ran in to a old trailer. Pt states it looks like a puncture wound. this happened 1 hour ago. ( right leg )

## 2018-10-17 NOTE — ED Provider Notes (Signed)
[facility]ENTER   [id number] 10/16/18 Arrival Time: 1839  ASSESSMENT & PLAN:  1. Right leg injury, initial encounter   2. Leg laceration, right, initial encounter    No indication for imaging at this time. Discussed.  Procedure: Verbal consent obtained. Patient provided with risks and alternatives to the procedure. Wound copiously irrigated with NS then cleansed with betadine. Local anesthesia: Lidocaine 2% without epinephrine. Wound carefully explored. No foreign body, tendon injury, or nonviable tissue were noted. Using sterile technique, 3 interrupted 4-0 Prolene sutures were placed to reapproximate the wound. Procedure tolerated well. No complications. Minimal bleeding. Advised to look for and return for any signs of infection such as redness, swelling, discharge, or worsening pain. Return for suture removal in 7-10 days.  See AVS for d/c instructions.  Reviewed expectations re: course of current medical issues. Questions answered. Outlined signs and symptoms indicating need for more acute intervention. Patient verbalized understanding. After Visit Summary given.   SUBJECTIVE:  Steven Burch is a 23 y.o. male who presents with a laceration of his R lower leg. Report hitting lower leg on the corner of a metal trailer. Wearing trousers that did not rip. Minimal bleeding; easily controlled. No extremity sensation changes or weakness. Ambulatory without difficulty. No specific aggravating or alleviating factors reported. Minimal leg pain. No OTC tx.  Td UTD: Yes.  ROS: As per HPI.  Health Maintenance Due  Topic Date Due  . HIV Screening  08/16/2010  . TETANUS/TDAP  01/23/2017   OBJECTIVE:  Vitals:   10/16/18 1909 10/16/18 1911  BP: 117/69   Pulse: 72   Resp: 16   Temp: 98.7 F (37.1 C)   TempSrc: Oral   Weight:  70.3 kg    General appearance: alert; no distress Skin: linear laceration of R anterior lower leg; size: approx 1 cm; clean wound edges, no foreign  bodies; with minimal bleeding Neuro: normal distal sensation; normal gait Psychological: alert and cooperative; normal mood and affect    Labs Reviewed - No data to display  No results found.  Allergies  Allergen Reactions  . Nsaids Other (See Comments)    Patient has STOMACH ISSUES and cannot tolerate NSAIDS (of any kind)    Past Medical History:  Diagnosis Date  . Multiple gastric ulcers    Social History   Socioeconomic History  . Marital status: Single    Spouse name: Not on file  . Number of children: 0  . Years of education: Not on file  . Highest education level: Not on file  Occupational History  . Occupation: IT sales professional  Social Needs  . Financial resource strain: Not on file  . Food insecurity:    Worry: Not on file    Inability: Not on file  . Transportation needs:    Medical: Not on file    Non-medical: Not on file  Tobacco Use  . Smoking status: Never Smoker  . Smokeless tobacco: Never Used  Substance and Sexual Activity  . Alcohol use: No  . Drug use: Yes    Types: Marijuana    Comment: 06/07/18  . Sexual activity: Yes  Lifestyle  . Physical activity:    Days per week: Not on file    Minutes per session: Not on file  . Stress: Not on file  Relationships  . Social connections:    Talks on phone: Not on file    Gets together: Not on file    Attends religious service: Not on file  Active member of club or organization: Not on file    Attends meetings of clubs or organizations: Not on file    Relationship status: Not on file  Other Topics Concern  . Not on file  Social History Narrative  . Not on file         Mardella LaymanHagler, Liberato Stansbery, MD 10/17/18 [phone]

## 2018-10-19 ENCOUNTER — Encounter (HOSPITAL_COMMUNITY): Payer: Self-pay | Admitting: Student

## 2018-10-19 ENCOUNTER — Emergency Department (HOSPITAL_COMMUNITY)
Admission: EM | Admit: 2018-10-19 | Discharge: 2018-10-19 | Disposition: A | Discharge status: Home / Self Care | Payer: Medicaid Other | Attending: Emergency Medicine | Admitting: Emergency Medicine

## 2018-10-19 ENCOUNTER — Other Ambulatory Visit: Payer: Self-pay

## 2018-10-19 DIAGNOSIS — R112 Nausea with vomiting, unspecified: Secondary | ICD-10-CM | POA: Insufficient documentation

## 2018-10-19 DIAGNOSIS — Z79899 Other long term (current) drug therapy: Secondary | ICD-10-CM | POA: Insufficient documentation

## 2018-10-19 DIAGNOSIS — R1013 Epigastric pain: Secondary | ICD-10-CM | POA: Insufficient documentation

## 2018-10-19 LAB — COMPREHENSIVE METABOLIC PANEL
ALT: 12 U/L (ref 0–44)
AST: 18 U/L (ref 15–41)
Albumin: 4.8 g/dL (ref 3.5–5.0)
Alkaline Phosphatase: 69 U/L (ref 38–126)
Anion gap: 16 — ABNORMAL HIGH (ref 5–15)
BUN: 13 mg/dL (ref 6–20)
CO2: 20 mmol/L — ABNORMAL LOW (ref 22–32)
Calcium: 9.9 mg/dL (ref 8.9–10.3)
Chloride: 103 mmol/L (ref 98–111)
Creatinine, Ser: 1.04 mg/dL (ref 0.61–1.24)
GFR calc Af Amer: >60 mL/min (ref >60)
GFR calc non Af Amer: >60 mL/min (ref >60)
Glucose, Bld: 124 mg/dL — ABNORMAL HIGH (ref 70–99)
Potassium: 3.4 mmol/L — ABNORMAL LOW (ref 3.5–5.1)
Sodium: 139 mmol/L (ref 135–145)
Total Bilirubin: 0.7 mg/dL (ref 0.3–1.2)
Total Protein: 7.6 g/dL (ref 6.5–8.1)

## 2018-10-19 LAB — LIPASE, BLOOD: Lipase: 24 U/L (ref 11–51)

## 2018-10-19 LAB — CBC
HCT: 45.8 % (ref 39.0–52.0)
Hemoglobin: 15.3 g/dL (ref 13.0–17.0)
MCH: 28.9 pg (ref 26.0–34.0)
MCHC: 33.4 g/dL (ref 30.0–36.0)
MCV: 86.6 fL (ref 80.0–100.0)
Platelets: 281 10*3/uL (ref 150–400)
RBC: 5.29 MIL/uL (ref 4.22–5.81)
RDW: 12.6 % (ref 11.5–15.5)
WBC: 16.1 10*3/uL — ABNORMAL HIGH (ref 4.0–10.5)
nRBC: 0 % (ref 0.0–0.2)

## 2018-10-19 MED ORDER — HALOPERIDOL LACTATE 5 MG/ML IJ SOLN
2.0000 mg | Freq: Once | INTRAMUSCULAR | Status: AC
  Administered 2018-10-19: 2 mg via INTRAVENOUS
  Filled 2018-10-19: qty 1

## 2018-10-19 MED ORDER — PROMETHAZINE HCL 25 MG PO TABS: 25.0000 mg | ORAL_TABLET | Freq: Four times a day (QID) | ORAL | 1 refills | Status: DC | PRN

## 2018-10-19 MED ORDER — LORAZEPAM 2 MG/ML IJ SOLN: 0.5000 mg | Freq: Once | INTRAMUSCULAR | Status: DC

## 2018-10-19 MED ORDER — FAMOTIDINE IN NACL 20-0.9 MG/50ML-% IV SOLN
20.0000 mg | Freq: Once | INTRAVENOUS | Status: AC
  Administered 2018-10-19: 20 mg via INTRAVENOUS
  Filled 2018-10-19: qty 50

## 2018-10-19 MED ORDER — LIDOCAINE VISCOUS HCL 2 % MT SOLN: 15.0000 mL | Freq: Once | OROMUCOSAL | Status: DC

## 2018-10-19 MED ORDER — SODIUM CHLORIDE 0.9 % IV BOLUS
1000.0000 mL | Freq: Once | INTRAVENOUS | Status: AC
  Administered 2018-10-19: 1000 mL via INTRAVENOUS

## 2018-10-19 MED ORDER — METOCLOPRAMIDE HCL 5 MG/ML IJ SOLN
10.0000 mg | Freq: Once | INTRAMUSCULAR | Status: AC
  Administered 2018-10-19: 10 mg via INTRAVENOUS
  Filled 2018-10-19: qty 2

## 2018-10-19 MED ORDER — PROMETHAZINE HCL 25 MG/ML IJ SOLN
12.5000 mg | Freq: Once | INTRAMUSCULAR | Status: AC
  Administered 2018-10-19: 12.5 mg via INTRAVENOUS
  Filled 2018-10-19: qty 1

## 2018-10-19 MED ORDER — ALUM & MAG HYDROXIDE-SIMETH 200-200-20 MG/5ML PO SUSP: 30.0000 mL | Freq: Once | ORAL | Status: DC

## 2018-10-19 MED ORDER — DIPHENHYDRAMINE HCL 50 MG/ML IJ SOLN
12.5000 mg | Freq: Once | INTRAMUSCULAR | Status: AC
  Administered 2018-10-19: 12.5 mg via INTRAVENOUS
  Filled 2018-10-19: qty 1

## 2018-10-19 NOTE — ED Notes (Signed)
Patient verbalizes understanding of discharge instructions. Opportunity for questioning and answers were provided. Armband removed by staff, pt discharged from ED.  

## 2018-10-19 NOTE — ED Triage Notes (Signed)
Pt arrives POV for eval of emesis w/ generalized abd pain. States onset this AM, hx of same. Denies diarrhea. Denies marijuana use today.

## 2018-10-19 NOTE — ED Notes (Signed)
Unable to obtain ekg at this time; pt. Actively vomiting

## 2018-10-19 NOTE — ED Provider Notes (Signed)
[facility] EMERGENCY DEPARTMENT Provider Note   CSN: [id number] Arrival date & time: 10/19/18  1511    History   Chief Complaint Chief Complaint  Patient presents with  . Emesis  . Abdominal Pain    HPI Steven Burch is a 23 y.o. male w/ a hx of GERD, chronic gastritis, known gastric ulcers who presents to the ED w/ complaints of N/V & abdominal pain that began at 1900 last evening. Patient reports 15-20 episodes of non bloody emesis associated w/ epigastric abdominal pain described as aching. Pain is an 8/10 in severity, worse w/ emesis, no alleviating factors. Tried taking his prescribed PO phenergan last night and subsequently vomited it back up, this was last pill he had left.  Last BM was this AM and was normal. Denies fever, chills, hematemesis, diarrhea, melena, hematochezia, or dysuria. Reports this is an ongoing intermittent problem for several years, feels the same as his prior ED visit symptoms- no change. Admits to marijuana use- last used 3 days prior. Emesis began shortly after having 1 wine cooler last night- does not usually drink.      HPI  Past Medical History:  Diagnosis Date  . Multiple gastric ulcers     Patient Active Problem List   Diagnosis Date Noted  . Healthcare maintenance 02/26/2018  . Nausea and vomiting 02/26/2018  . Gastroesophageal reflux disease 02/26/2018    Past Surgical History:  Procedure Laterality Date  . WRIST SURGERY Right    as a child        Home Medications    Prior to Admission medications   Medication Sig Start Date End Date Taking? Authorizing Provider  benzonatate (TESSALON) 100 MG capsule Take 2 capsules (200 mg total) by mouth 3 (three) times daily as needed for cough. 10/08/18   Linus Mako B, NP  ipratropium (ATROVENT) 0.06 % nasal spray Place 2 sprays into both nostrils 4 (four) times daily. 10/08/18   Georgetta Haber, NP  metoCLOPramide (REGLAN) 10 MG tablet Take 1 tablet (10 mg total) by  mouth every 6 (six) hours. Patient not taking: Reported on 05/29/2018 05/16/18   Elson Areas, PA-C  omeprazole (PRILOSEC) 20 MG capsule Take 1 capsule (20 mg total) by mouth daily. 04/18/18 06/12/18  Long, Arlyss Repress, MD  omeprazole (PRILOSEC) 40 MG capsule Take 1 capsule (40 mg total) by mouth daily. Patient not taking: Reported on 06/12/2018 06/08/18   Rachael Fee, MD  ondansetron (ZOFRAN) 8 MG tablet Take 1 tablet (8 mg total) by mouth every 8 (eight) hours as needed for nausea or vomiting. Patient not taking: Reported on 05/29/2018 02/26/18   Julaine Fusi, NP  promethazine (PHENERGAN) 25 MG tablet Take 1 tablet (25 mg total) by mouth every 6 (six) hours as needed for nausea or vomiting. 06/12/18   Eyvonne Mechanic, PA-C    Family History Family History  Problem Relation Age of Onset  . Diabetes Maternal Grandmother   . Diabetes Maternal Grandfather   . Heart attack Maternal Grandfather        mild  . Colon cancer Neg Hx   . Esophageal cancer Neg Hx   . Rectal cancer Neg Hx   . Stomach cancer Neg Hx     Social History Social History   Tobacco Use  . Smoking status: Never Smoker  . Smokeless tobacco: Never Used  Substance Use Topics  . Alcohol use: No  . Drug use: Yes    Types: Marijuana  Comment: 06/07/18     Allergies   Nsaids   Review of Systems Review of Systems  Constitutional: Negative for chills and fever.  Respiratory: Negative for shortness of breath.   Cardiovascular: Negative for chest pain.  Gastrointestinal: Positive for abdominal pain, nausea and vomiting. Negative for anal bleeding, blood in stool, constipation and diarrhea.  Genitourinary: Negative for dysuria.  All other systems reviewed and are negative.    Physical Exam Updated Vital Signs BP 130/73 (BP Location: Right Arm)   Pulse 65   Temp 98.5 F (36.9 C)   Resp 18   Ht 5\' 7"  (1.702 m)   Wt 72.6 kg   SpO2 99%   BMI 25.06 kg/m   Physical Exam Vitals signs and nursing  note reviewed.  Constitutional:      Appearance: He is well-developed. He is not toxic-appearing.     Comments: Retching on exam.   HENT:     Head: Normocephalic and atraumatic.     Mouth/Throat:     Comments: MM dry Eyes:     General:        Right eye: No discharge.        Left eye: No discharge.     Conjunctiva/sclera: Conjunctivae normal.  Neck:     Musculoskeletal: Neck supple.  Cardiovascular:     Rate and Rhythm: Normal rate and regular rhythm.  Pulmonary:     Effort: Pulmonary effort is normal. No respiratory distress.     Breath sounds: Normal breath sounds. No wheezing, rhonchi or rales.  Abdominal:     General: There is no distension.     Palpations: Abdomen is soft.     Tenderness: There is abdominal tenderness (mild) in the epigastric area. There is no guarding or rebound. Negative signs include Murphy's sign and McBurney's sign.  Skin:    General: Skin is warm and dry.     Findings: No rash.  Neurological:     Mental Status: He is alert.     Comments: Clear speech.   Psychiatric:        Behavior: Behavior normal.    ED Treatments / Results  Labs (all labs ordered are listed, but only abnormal results are displayed) Labs Reviewed  CBC - Abnormal; Notable for the following components:      Result Value   WBC 16.1 (*)    All other components within normal limits  COMPREHENSIVE METABOLIC PANEL - Abnormal; Notable for the following components:   Potassium 3.4 (*)    CO2 20 (*)    Glucose, Bld 124 (*)    Anion gap 16 (*)    All other components within normal limits  LIPASE, BLOOD    EKG EKG Interpretation  Date/Time:  Friday Oct 19 2018 16:30:11 EDT Ventricular Rate:  49 PR Interval:    QRS Duration: 88 QT Interval:  438 QTC Calculation: 396 R Axis:   102 Text Interpretation:  Sinus bradycardia Borderline right axis deviation Confirmed by Kennis CarinaBero, Michael [phone]([id number]) on 10/19/2018 6:34:29 PM   Radiology No results found.  Procedures Procedures  (including critical care time)  Medications Ordered in ED Medications  sodium chloride 0.9 % bolus 1,000 mL (0 mLs Intravenous Stopped 10/19/18 1731)  famotidine (PEPCID) IVPB 20 mg premix (0 mg Intravenous Stopped 10/19/18 1630)  promethazine (PHENERGAN) injection 12.5 mg (12.5 mg Intravenous Given 10/19/18 1643)  sodium chloride 0.9 % bolus 1,000 mL (0 mLs Intravenous Stopped 10/19/18 1908)  metoCLOPramide (REGLAN) injection 10 mg (10 mg Intravenous Given  10/19/18 1752)  diphenhydrAMINE (BENADRYL) injection 12.5 mg (12.5 mg Intravenous Given 10/19/18 1752)  haloperidol lactate (HALDOL) injection 2 mg (2 mg Intravenous Given 10/19/18 1906)   Initial Impression / Assessment and Plan / ED Course  I have reviewed the triage vital signs and the nursing notes.  Pertinent labs & imaging results that were available during my care of the patient were reviewed by me and considered in my medical decision making (see chart for details).   Patient w/ hx of chronic gastritis, GERD, known gastric ulcers presents to the ED w/ N/V w/ associated epigastric abdominal pain. Nontoxic appearing, vomiting on exam, vitals WNL. Mild epigastric tenderness, no peritoneal signs. Plan for EKG to check QTc. Labs. Anti-acids & anti-emetics. QTc 396.   Chart review: upper endoscopy 05/2018- LA grade A reflux esophagitis, mild non-specific gastritis- biopsied- non cancerous, no h. pylori, otherwise normal.  This is patient's 10th ER visit for similar in the past 1 year.   17:00: RE-EVAL: Continued emesis following phenergan. Reglan/benadryl ordered.   18:40: RE-EVAL: No emesis s/p reglan/benadryl, but continues to feel nauseated. Will administer haldol.   Work-up reviewed: Leukocytosis at 16.1 felt to be nonspecific.  No anemia.  Mild hypokalemia at 3.4.  Patient's bicarb is slightly low at 20 with a mild elevation in his anion gap at 16, this is likely secondary to nausea and vomiting.    He has received 2 L of fluids in the  emergency department.  Feeling significantly better following Haldol.  He has been able to tolerate p.o.  He is he feels ready to go home.  Will discontinue urinalysis/UDS, do not suspect UTI.  Patient states sxs are the same as prior chronic gastritis, repeat abdominal exam remains without peritoneal signs, doubt cholecystitis, pancreatitis, duration, obstruction, diverticulitis, or appendicitis. Hgb/hct & BUN WNL w/ no hematemesis or reports of blood in stool do not suspect acute GI bleed.  Will discharge home with a few tablets of Phenergan, encouraged him to continue taking Prilosec which he has a prescription for at home, also provided diet recommendations. I discussed results, treatment plan, need for follow-up, and return precautions with the patient. Provided opportunity for questions, patient confirmed understanding and is in agreement with plan.    Findings and plan of care discussed with supervising physician Dr. Pilar Plate who is in agreement.   Blood pressure 113/69, pulse (!) 57, temperature 98.5 F (36.9 C), resp. rate 11, height  (1.702 m), weight 72.6 kg, SpO2 100 %. Final Clinical Impressions(s) / ED Diagnoses   Final diagnoses:  Non-intractable vomiting with nausea, unspecified vomiting type    ED Discharge Orders         Ordered    promethazine (PHENERGAN) 25 MG tablet  Every 6 hours PRN     10/19/18 [address] PA-C 10/19/18 2017    Sabas Sous, MD 10/20/18 1501

## 2018-10-19 NOTE — Discharge Instructions (Addendum)
You are seen in the emergency department today for nausea, vomiting, and abdominal pain.  Your white blood cell count was a bit elevated at 16.1, your bicarb was a bit low and your anion gap is a bit high, we suspect all these findings are consistent with dehydration from your vomiting.  These are things that should be rechecked by primary care provider within 3 days.   Please take Phenergan as prescribed. We have prescribed you new medication(s) today. Discuss the medications prescribed today with your pharmacist as they can have adverse effects and interactions with your other medicines including over the counter and prescribed medications. Seek medical evaluation if you start to experience new or abnormal symptoms after taking one of these medicines, seek care immediately if you start to experience difficulty breathing, feeling of your throat closing, facial swelling, or rash as these could be indications of a more serious allergic reaction Please continue your omeprazole.  Follow the diet guidelines in your discharge instructions.  Follow-up with primary care within 3 days, return to the ER for new or worsening symptoms including but not limited to inability to keep fluids down, fever, worsened abdominal pain, change in quality of abdominal pain, blood in your vomit, blood in your stool, or any other concerns.

## 2018-10-29 ENCOUNTER — Ambulatory Visit (HOSPITAL_COMMUNITY)
Admission: EM | Admit: 2018-10-29 | Discharge: 2018-10-29 | Disposition: A | Discharge status: Home / Self Care | Payer: Medicaid Other | Attending: Family Medicine | Admitting: Family Medicine

## 2018-10-29 ENCOUNTER — Encounter (HOSPITAL_COMMUNITY): Payer: Self-pay | Admitting: Emergency Medicine

## 2018-10-29 ENCOUNTER — Other Ambulatory Visit: Payer: Self-pay

## 2018-10-29 DIAGNOSIS — R059 Cough, unspecified: Secondary | ICD-10-CM

## 2018-10-29 DIAGNOSIS — R05 Cough: Secondary | ICD-10-CM

## 2018-10-29 MED ORDER — HYDROCODONE-HOMATROPINE 5-1.5 MG/5ML PO SYRP: 5.0000 mL | ORAL_SOLUTION | Freq: Four times a day (QID) | ORAL | 0 refills | Status: DC | PRN

## 2018-10-29 MED ORDER — CETIRIZINE HCL 10 MG PO TABS: 10.0000 mg | ORAL_TABLET | Freq: Every day | ORAL | 0 refills | Status: DC

## 2018-10-29 NOTE — ED Triage Notes (Signed)
Onset 2 weeks ago started feeling bad.  Patient has had a cough that would not go away.   Patient was in [employer] on 5/13.  Patient now has a headache, ears throbbing, sore throat, continued cough, difficulty catching breath with coughing   No diarrhea

## 2018-10-29 NOTE — Discharge Instructions (Addendum)
I believe the cough is viral  I will give you something stronger for cough and extend your work note.  Recommend starting zyrtec daily for allergies to see if this helps.  Follow up as needed for continued or worsening symptoms

## 2018-10-30 NOTE — ED Provider Notes (Signed)
[facility]    CSN: [id number] Arrival date & time: 10/29/18  1011     History   Chief Complaint Chief Complaint  Patient presents with   Cough    HPI Steven Burch is a 23 y.o. male.   Patient is a 24 year old male who presents today with continued cough, congestion.  He was seen here few weeks ago for similar symptoms.  Reports his symptoms were improving but then he traveled to the beach this past weekend and symptoms seem to return.  He has been taking the cough medicine that was prescribed without much relief.  The coughing is worse at night.  He has had mild sore throat and earache.  Denies any fevers, chest pain or shortness of breath..  He is here with concerns of going back to work due to still having symptoms and strict rules.  Girlfriend sick with similar symptoms.  ROS per HPI      Past Medical History:  Diagnosis Date   Multiple gastric ulcers     Patient Active Problem List   Diagnosis Date Noted   Healthcare maintenance 02/26/2018   Nausea and vomiting 02/26/2018   Gastroesophageal reflux disease 02/26/2018    Past Surgical History:  Procedure Laterality Date   WRIST SURGERY Right    as a child       Home Medications    Prior to Admission medications   Medication Sig Start Date End Date Taking? Authorizing Provider  omeprazole (PRILOSEC) 40 MG capsule Take 1 capsule (40 mg total) by mouth daily. 06/08/18  Yes Rachael Fee, MD  cetirizine (ZYRTEC) 10 MG tablet Take 1 tablet (10 mg total) by mouth daily. 10/29/18   Tamon Parkerson, Gloris Manchester A, NP  HYDROcodone-homatropine (HYCODAN) 5-1.5 MG/5ML syrup Take 5 mLs by mouth every 6 (six) hours as needed for cough. 10/29/18   Dahlia Byes A, NP  omeprazole (PRILOSEC) 20 MG capsule Take 1 capsule (20 mg total) by mouth daily. 04/18/18 06/12/18  Long, Arlyss Repress, MD  promethazine (PHENERGAN) 25 MG tablet Take 1 tablet (25 mg total) by mouth every 6 (six) hours as needed for nausea or vomiting. 10/19/18    Petrucelli, Pleas Koch, PA-C    Family History Family History  Problem Relation Age of Onset   Diabetes Maternal Grandmother    Diabetes Maternal Grandfather    Heart attack Maternal Grandfather        mild   Colon cancer Neg Hx    Esophageal cancer Neg Hx    Rectal cancer Neg Hx    Stomach cancer Neg Hx     Social History Social History   Tobacco Use   Smoking status: Never Smoker   Smokeless tobacco: Never Used  Substance Use Topics   Alcohol use: No   Drug use: Yes    Types: Marijuana    Comment: 06/07/18     Allergies   Nsaids   Review of Systems Review of Systems   Physical Exam Triage Vital Signs ED Triage Vitals  Enc Vitals Group     BP 10/29/18 1050 104/62     Pulse Rate 10/29/18 1050 60     Resp 10/29/18 1050 16     Temp 10/29/18 1050 98.6 F (37 C)     Temp Source 10/29/18 1050 Oral     SpO2 10/29/18 1050 99 %     Weight --      Height --      Head Circumference --  Peak Flow --      Pain Score 10/29/18 1047 10     Pain Loc --      Pain Edu? --      Excl. in GC? --    No data found.  Updated Vital Signs BP 104/62 (BP Location: Left Arm)    Pulse 60    Temp 98.6 F (37 C) (Oral)    Resp 16    SpO2 99%   Visual Acuity Right Eye Distance:   Left Eye Distance:   Bilateral Distance:    Right Eye Near:   Left Eye Near:    Bilateral Near:     Physical Exam Vitals signs and nursing note reviewed.  Constitutional:      Appearance: He is well-developed.  HENT:     Head: Normocephalic and atraumatic.  Eyes:     Conjunctiva/sclera: Conjunctivae normal.  Neck:     Musculoskeletal: Neck supple.  Cardiovascular:     Rate and Rhythm: Normal rate and regular rhythm.     Heart sounds: No murmur.  Pulmonary:     Effort: Pulmonary effort is normal. No respiratory distress.     Breath sounds: Normal breath sounds.  Abdominal:     Palpations: Abdomen is soft.     Tenderness: There is no abdominal tenderness.  Skin:     General: Skin is warm and dry.  Neurological:     Mental Status: He is alert.      UC Treatments / Results  Labs (all labs ordered are listed, but only abnormal results are displayed) Labs Reviewed - No data to display  EKG None  Radiology No results found.  Procedures Procedures (including critical care time)  Medications Ordered in UC Medications - No data to display  Initial Impression / Assessment and Plan / UC Course  I have reviewed the triage vital signs and the nursing notes.  Pertinent labs & imaging results that were available during my care of the patient were reviewed by me and considered in my medical decision making (see chart for details).     Exam benign.  Patient just had a normal x-ray 2 weeks ago.  His vital signs are stable and he is nontoxic or ill-appearing. Patient with lingering symptoms due to some sort of viral illness. Gave patient stronger cough medication for symptoms Instructed it would be safe for him to return to work in a few days. Follow up as needed for continued or worsening symptoms  Final Clinical Impressions(s) / UC Diagnoses   Final diagnoses:  Cough     Discharge Instructions     I believe the cough is viral  I will give you something stronger for cough and extend your work note.  Recommend starting zyrtec daily for allergies to see if this helps.  Follow up as needed for continued or worsening symptoms     ED Prescriptions    Medication Sig Dispense Auth. Provider   HYDROcodone-homatropine (HYCODAN) 5-1.5 MG/5ML syrup Take 5 mLs by mouth every 6 (six) hours as needed for cough. 120 mL Sicilia Killough A, NP   cetirizine (ZYRTEC) 10 MG tablet Take 1 tablet (10 mg total) by mouth daily. 30 tablet Dahlia ByesBast, Rhiana Morash A, NP     Controlled Substance Prescriptions [place] Controlled Substance Registry consulted? Not Applicable   Janace ArisBast, Dmani Mizer A, NP 10/30/18 [phone]

## 2018-11-13 ENCOUNTER — Encounter (HOSPITAL_COMMUNITY): Payer: Self-pay

## 2018-11-13 ENCOUNTER — Telehealth: Payer: Self-pay | Admitting: *Deleted

## 2018-11-13 ENCOUNTER — Ambulatory Visit (HOSPITAL_COMMUNITY)
Admission: EM | Admit: 2018-11-13 | Discharge: 2018-11-13 | Disposition: A | Discharge status: Home / Self Care | Payer: Medicaid Other | Attending: Family Medicine | Admitting: Family Medicine

## 2018-11-13 ENCOUNTER — Other Ambulatory Visit: Payer: Self-pay

## 2018-11-13 DIAGNOSIS — Z20822 Contact with and (suspected) exposure to covid-19: Secondary | ICD-10-CM

## 2018-11-13 DIAGNOSIS — R05 Cough: Secondary | ICD-10-CM

## 2018-11-13 DIAGNOSIS — J208 Acute bronchitis due to other specified organisms: Secondary | ICD-10-CM | POA: Insufficient documentation

## 2018-11-13 DIAGNOSIS — R6889 Other general symptoms and signs: Secondary | ICD-10-CM

## 2018-11-13 LAB — CBC
HCT: 44.4 % (ref 39.0–52.0)
Hemoglobin: 15 g/dL (ref 13.0–17.0)
MCH: 29 pg (ref 26.0–34.0)
MCHC: 33.8 g/dL (ref 30.0–36.0)
MCV: 85.7 fL (ref 80.0–100.0)
Platelets: 200 10*3/uL (ref 150–400)
RBC: 5.18 MIL/uL (ref 4.22–5.81)
RDW: 12.9 % (ref 11.5–15.5)
WBC: 6.9 10*3/uL (ref 4.0–10.5)
nRBC: 0 % (ref 0.0–0.2)

## 2018-11-13 MED ORDER — BENZONATATE 100 MG PO CAPS: 100.0000 mg | ORAL_CAPSULE | Freq: Three times a day (TID) | ORAL | 0 refills | Status: AC | PRN

## 2018-11-13 MED ORDER — AZITHROMYCIN 250 MG PO TABS: 250.0000 mg | ORAL_TABLET | Freq: Every day | ORAL | 0 refills | Status: AC

## 2018-11-13 MED ORDER — CETIRIZINE HCL 10 MG PO TABS: 10.0000 mg | ORAL_TABLET | Freq: Every day | ORAL | 0 refills | Status: AC

## 2018-11-13 MED ORDER — PREDNISONE 20 MG PO TABS: 20.0000 mg | ORAL_TABLET | Freq: Two times a day (BID) | ORAL | 0 refills | Status: AC

## 2018-11-13 NOTE — ED Triage Notes (Signed)
Patient presents to Urgent Care with complaints of cough on going since about two months ago. Patient reports the cough is productive, dark brown colored. Pt reports needing a note for work, did complete cough medication he received during his last visit, stated it helped some.

## 2018-11-13 NOTE — Telephone Encounter (Signed)
Patient called and scheduled for testing on 11/14/18 at 10 am at [facility] site.

## 2018-11-13 NOTE — Discharge Instructions (Addendum)
You will be contacted for COVID testing by [facility] - please self-isolate until your results are back. Take tessalon pearls as needed for cough. Take Z-pack as instructed. Take prednisone twice daily as written. Please follow up with your PCP. Please call office if you develop fever, shortness of breath, worsening cough, vomiting, or diarrhea.

## 2018-11-13 NOTE — ED Provider Notes (Addendum)
[facility]    CSN: [id number] Arrival date & time: 11/13/18  1013     History   Chief Complaint Chief Complaint  Patient presents with   Cough   Letter for School/Work    HPI Steven Burch is a 23 y.o. male presenting for cough.  Patient states that this is been ongoing for over a month now.  Patient has been evaluated in urgent care on 4/27 and 5/18 for same symptoms.  Today, patient states that cough is intermittent, largely dry, though sometimes productive with brown sputum; non-hemoptic.  She denies chest pain, shortness of breath, fever.  Patient states that he gets hot flashes at night, though is unsure how often: "a couple times a week may be ".  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  Patient has not undergone COVID testing, but I just did not think to do it ".  Patient denies contact with known positive COVID persons.  States that his girlfriend has had similar symptomatology over the last month.  Denies headache, change in smell/taste, nasal congestion, ear/throat pain, abdominal pain, nausea, vomiting, diarrhea.  Patient states he was given a cough syrup at his last appointment which helped "a little bit but then it came back ".  Patient denies history of asthma, allergies.  Has not followed up with his primary care throughout his illness.     Past Medical History:  Diagnosis Date   Multiple gastric ulcers     Patient Active Problem List   Diagnosis Date Noted   Healthcare maintenance 02/26/2018   Nausea and vomiting 02/26/2018   Gastroesophageal reflux disease 02/26/2018    Past Surgical History:  Procedure Laterality Date   WRIST SURGERY Right    as a child       Home Medications    Prior to Admission medications   Medication Sig Start Date End Date Taking? Authorizing Provider  promethazine (PHENERGAN) 25 MG tablet Take 1 tablet (25 mg total) by mouth every 6 (six) hours as needed for nausea or vomiting.  10/19/18  Yes Petrucelli, Samantha R, PA-C  azithromycin (ZITHROMAX) 250 MG tablet Take 1 tablet (250 mg total) by mouth daily. Take first 2 tablets together, then 1 every day until finished. 11/13/18   Hall-Potvin, [place], PA-C  benzonatate (TESSALON) 100 MG capsule Take 1 capsule (100 mg total) by mouth 3 (three) times daily as needed for up to 5 days for cough. 11/13/18 11/18/18  Hall-Potvin, [place], PA-C  cetirizine (ZYRTEC) 10 MG tablet Take 1 tablet (10 mg total) by mouth daily. 11/13/18   Hall-Potvin, [place], PA-C  omeprazole (PRILOSEC) 20 MG capsule Take 1 capsule (20 mg total) by mouth daily. 04/18/18 06/12/18  Long, Arlyss RepressJoshua G, MD  predniSONE (DELTASONE) 20 MG tablet Take 1 tablet (20 mg total) by mouth 2 (two) times daily with a meal for 5 days. 11/13/18 11/18/18  Hall-Potvin, [place], PA-C    Family History Family History  Problem Relation Age of Onset   Healthy Mother    Healthy Father    Diabetes Maternal Grandmother    Diabetes Maternal Grandfather    Heart attack Maternal Grandfather        mild   Colon cancer Neg Hx    Esophageal cancer Neg Hx    Rectal cancer Neg Hx    Stomach cancer Neg Hx     Social History Social History   Tobacco Use   Smoking status: Never Smoker   Smokeless tobacco: Never  Used  Substance Use Topics   Alcohol use: No   Drug use: Yes    Types: Marijuana    Comment: "cut back a lot"     Allergies   Nsaids   Review of Systems As per HPI   Physical Exam Triage Vital Signs ED Triage Vitals  Enc Vitals Group     BP 11/13/18 1023 114/62     Pulse Rate 11/13/18 1023 (!) 52     Resp 11/13/18 1023 18     Temp 11/13/18 1023 98.9 F (37.2 C)     Temp Source 11/13/18 1023 Oral     SpO2 11/13/18 1023 100 %     Weight --      Height --      Head Circumference --      Peak Flow --      Pain Score 11/13/18 1021 0     Pain Loc --      Pain Edu? --      Excl. in GC? --    No data found.  Updated Vital Signs BP 114/62 (BP  Location: Left Arm)    Pulse (!) 52    Temp 98.9 F (37.2 C) (Oral)    Resp 18    SpO2 100%   Visual Acuity Right Eye Distance:   Left Eye Distance:   Bilateral Distance:    Right Eye Near:   Left Eye Near:    Bilateral Near:     Physical Exam Constitutional:      General: He is not in acute distress. HENT:     Head: Normocephalic and atraumatic.  Eyes:     General: No scleral icterus.    Pupils: Pupils are equal, round, and reactive to light.  Cardiovascular:     Rate and Rhythm: Normal rate and regular rhythm.     Heart sounds: No murmur.     Comments: Manual pulse 67 Pulmonary:     Effort: Pulmonary effort is normal. No respiratory distress.     Breath sounds: No stridor. No wheezing, rhonchi or rales.     Comments: Decreased breath sounds throughout.  When distracted, patient has good airflow, is able to.  Good inspiratory effort/flow when prompted to cough. Skin:    Coloration: Skin is not jaundiced or pale.  Neurological:     Mental Status: He is alert and oriented to person, place, and time.      UC Treatments / Results  Labs (all labs ordered are listed, but only abnormal results are displayed) Labs Reviewed  CBC    EKG None  Radiology No results found.  Procedures Procedures (including critical care time)  Medications Ordered in UC Medications - No data to display  Initial Impression / Assessment and Plan / UC Course  I have reviewed the triage vital signs and the nursing notes.  Pertinent labs & imaging results that were available during my care of the patient were reviewed by me and considered in my medical decision making (see chart for details).     23 year old male without significant medical history presenting for chronic cough greater than 1 month.  Symptoms appear to be intermittent and difficult to elicit specific timeline from patient.  Patient's appearance and exam is reassuring.  Due to duration of cough will treat empirically with  Z-Pak, prednisone, Tessalon pearls.  COVID testing ordered due to duration of symptoms in setting of global pandemic.  She had elevated WBC on 5/8: Cannot remember if he had just completed  course of prednisone.  CBC repeated in office today: Normalized (6.9). Final Clinical Impressions(s) / UC Diagnoses   Final diagnoses:  Suspected Covid-19 Virus Infection  Viral bronchitis     Discharge Instructions     You will be contacted for COVID testing by [place] - please self-isolate until your results are back. Take tessalon pearls as needed for cough. Take Z-pack as instructed. Take prednisone twice daily as written. Please follow up with your PCP. Please call office if you develop fever, shortness of breath, worsening cough, vomiting, or diarrhea.    ED Prescriptions    Medication Sig Dispense Auth. Provider   cetirizine (ZYRTEC) 10 MG tablet Take 1 tablet (10 mg total) by mouth daily. 30 tablet Hall-Potvin, [place], PA-C   benzonatate (TESSALON) 100 MG capsule Take 1 capsule (100 mg total) by mouth 3 (three) times daily as needed for up to 5 days for cough. 15 capsule Hall-Potvin, [place], PA-C   azithromycin (ZITHROMAX) 250 MG tablet Take 1 tablet (250 mg total) by mouth daily. Take first 2 tablets together, then 1 every day until finished. 6 tablet Hall-Potvin, [place], PA-C   predniSONE (DELTASONE) 20 MG tablet Take 1 tablet (20 mg total) by mouth 2 (two) times daily with a meal for 5 days. 10 tablet Hall-Potvin, [place], PA-C     Controlled Substance Prescriptions [place] Controlled Substance Registry consulted? Not Applicable   Shea Evans, PA-C 11/13/18 1149    Hall-Potvin, [place], [place] 11/13/18 1151

## 2018-11-13 NOTE — Telephone Encounter (Signed)
-----   Message from [place], [place] sent at 11/13/2018 11:22 AM EDT ----- Regarding: Needing COVID testing URI symptoms with cough > 1 month.  Afebrile

## 2018-11-14 ENCOUNTER — Other Ambulatory Visit: Payer: Medicaid Other

## 2018-11-14 DIAGNOSIS — Z20822 Contact with and (suspected) exposure to covid-19: Secondary | ICD-10-CM

## 2018-11-16 LAB — NOVEL CORONAVIRUS, NAA: SARS-CoV-2, NAA: NOT DETECTED

## 2018-11-19 ENCOUNTER — Telehealth (HOSPITAL_COMMUNITY): Payer: Self-pay | Admitting: Emergency Medicine

## 2018-11-19 NOTE — Telephone Encounter (Signed)
Normal labs. Attempted to reach patient. No answer at this time. 

## 2018-11-19 NOTE — Progress Notes (Signed)
Good Morning [name], Can you please call Steven Burch and share- His recent COVID test is NEGATIVE Thanks! Valetta Fuller

## 2018-11-19 NOTE — Telephone Encounter (Signed)
Patient contacted and made aware of all results, all questions answered.   

## 2018-12-04 ENCOUNTER — Other Ambulatory Visit: Payer: Self-pay

## 2018-12-04 ENCOUNTER — Emergency Department (HOSPITAL_COMMUNITY)
Admission: EM | Admit: 2018-12-04 | Discharge: 2018-12-04 | Disposition: A | Discharge status: Home / Self Care | Payer: Medicaid Other | Attending: Emergency Medicine | Admitting: Emergency Medicine

## 2018-12-04 DIAGNOSIS — R110 Nausea: Secondary | ICD-10-CM | POA: Insufficient documentation

## 2018-12-04 DIAGNOSIS — K920 Hematemesis: Secondary | ICD-10-CM | POA: Insufficient documentation

## 2018-12-04 DIAGNOSIS — Z79899 Other long term (current) drug therapy: Secondary | ICD-10-CM | POA: Insufficient documentation

## 2018-12-04 LAB — CBC WITH DIFFERENTIAL/PLATELET
Abs Immature Granulocytes: 0.07 10*3/uL (ref 0.00–0.07)
Basophils Absolute: 0 10*3/uL (ref 0.0–0.1)
Basophils Relative: 0 %
Eosinophils Absolute: 0 10*3/uL (ref 0.0–0.5)
Eosinophils Relative: 0 %
HCT: 45.2 % (ref 39.0–52.0)
Hemoglobin: 15.2 g/dL (ref 13.0–17.0)
Immature Granulocytes: 1 %
Lymphocytes Relative: 14 %
Lymphs Abs: 2.1 10*3/uL (ref 0.7–4.0)
MCH: 29 pg (ref 26.0–34.0)
MCHC: 33.6 g/dL (ref 30.0–36.0)
MCV: 86.1 fL (ref 80.0–100.0)
Monocytes Absolute: 0.7 10*3/uL (ref 0.1–1.0)
Monocytes Relative: 5 %
Neutro Abs: 12 10*3/uL — ABNORMAL HIGH (ref 1.7–7.7)
Neutrophils Relative %: 80 %
Platelets: 250 10*3/uL (ref 150–400)
RBC: 5.25 MIL/uL (ref 4.22–5.81)
RDW: 12.9 % (ref 11.5–15.5)
WBC: 14.9 10*3/uL — ABNORMAL HIGH (ref 4.0–10.5)
nRBC: 0 % (ref 0.0–0.2)

## 2018-12-04 LAB — COMPREHENSIVE METABOLIC PANEL
ALT: 12 U/L (ref 0–44)
AST: 22 U/L (ref 15–41)
Albumin: 5 g/dL (ref 3.5–5.0)
Alkaline Phosphatase: 63 U/L (ref 38–126)
Anion gap: 11 (ref 5–15)
BUN: 11 mg/dL (ref 6–20)
CO2: 23 mmol/L (ref 22–32)
Calcium: 9.9 mg/dL (ref 8.9–10.3)
Chloride: 108 mmol/L (ref 98–111)
Creatinine, Ser: 1.11 mg/dL (ref 0.61–1.24)
GFR calc Af Amer: >60 mL/min (ref >60)
GFR calc non Af Amer: >60 mL/min (ref >60)
Glucose, Bld: 140 mg/dL — ABNORMAL HIGH (ref 70–99)
Potassium: 4.2 mmol/L (ref 3.5–5.1)
Sodium: 142 mmol/L (ref 135–145)
Total Bilirubin: 0.7 mg/dL (ref 0.3–1.2)
Total Protein: 7.7 g/dL (ref 6.5–8.1)

## 2018-12-04 LAB — LIPASE, BLOOD: Lipase: 26 U/L (ref 11–51)

## 2018-12-04 LAB — GASTRIC OCCULT BLOOD (1-CARD TO LAB)
Occult Blood, Gastric: POSITIVE — AB
pH, Gastric: 3

## 2018-12-04 MED ORDER — SODIUM CHLORIDE 0.9 % IV BOLUS
1000.0000 mL | Freq: Once | INTRAVENOUS | Status: AC
  Administered 2018-12-04: 1000 mL via INTRAVENOUS

## 2018-12-04 MED ORDER — PANTOPRAZOLE SODIUM 40 MG IV SOLR
40.0000 mg | Freq: Once | INTRAVENOUS | Status: AC
  Administered 2018-12-04: 40 mg via INTRAVENOUS
  Filled 2018-12-04: qty 40

## 2018-12-04 MED ORDER — METOCLOPRAMIDE HCL 5 MG/ML IJ SOLN
10.0000 mg | Freq: Once | INTRAMUSCULAR | Status: AC
  Administered 2018-12-04: 10 mg via INTRAVENOUS
  Filled 2018-12-04: qty 2

## 2018-12-04 MED ORDER — PROMETHAZINE HCL 25 MG RE SUPP: 25.0000 mg | Freq: Four times a day (QID) | RECTAL | 0 refills | Status: DC | PRN

## 2018-12-04 MED ORDER — OMEPRAZOLE 40 MG PO CPDR: 40.0000 mg | DELAYED_RELEASE_CAPSULE | Freq: Every day | ORAL | 0 refills | Status: DC

## 2018-12-04 MED ORDER — PROMETHAZINE HCL 25 MG PO TABS: 25.0000 mg | ORAL_TABLET | Freq: Four times a day (QID) | ORAL | 0 refills | Status: DC | PRN

## 2018-12-04 MED ORDER — ONDANSETRON HCL 4 MG/2ML IJ SOLN
4.0000 mg | Freq: Once | INTRAMUSCULAR | Status: AC
  Administered 2018-12-04: 4 mg via INTRAVENOUS
  Filled 2018-12-04: qty 2

## 2018-12-04 MED ORDER — PROMETHAZINE HCL 25 MG PO TABS
25.0000 mg | ORAL_TABLET | Freq: Once | ORAL | Status: AC
  Administered 2018-12-04: 25 mg via ORAL
  Filled 2018-12-04: qty 1

## 2018-12-04 MED ORDER — SODIUM CHLORIDE 0.9 % IV SOLN
INTRAVENOUS | Status: DC
  Administered 2018-12-04: 18:00:00 via INTRAVENOUS

## 2018-12-04 NOTE — ED Notes (Signed)
Pt has been in contact with family. 

## 2018-12-04 NOTE — Discharge Instructions (Signed)
You were seen in the ER for nausea, vomiting and blood in your vomit.  Your symptoms improved in the ER.  Your lab work was normal.  I suspect your bleeding is from recent forceful vomiting last night.  I spoke to Dr. Loletha Carrow with gastroenterology who recommends following up in the office with Dr. Ardis Hughs.  Start taking 40 mg of omeprazole every morning on an empty stomach.  Gentle diet and clear liquids until your symptoms slowly improve.  I have given you a prescription for Phenergan oral tablet to use as needed for lingering nausea.  I have also given you a prescription for Phenergan 25 mg suppository that you can use rectally when you have ongoing vomiting.  Avoid any ibuprofen or aspirin containing products.  Avoid any marijuana.  Avoid any alcohol.  Avoid any food or drink that is acidic such as tomatoes, coffee, sodas.  Return to the ER if your symptoms worsen or persist, you have fever, lightheadedness, chest pain, shortness of breath, blood in your stool or black stools.

## 2018-12-04 NOTE — ED Notes (Signed)
Patient verbalizes understanding of discharge instructions. Opportunity for questioning and answers were provided. Armband removed by staff, pt discharged from ED.  

## 2018-12-04 NOTE — ED Notes (Signed)
Pt is reportedly on promethazine and is currently out.

## 2018-12-04 NOTE — ED Notes (Signed)
Pt was unable to provide a urine sample.

## 2018-12-04 NOTE — ED Triage Notes (Addendum)
Pt states hx of ulcers- takes promethazine and omeprozole. Pt been vomiting all day. Does not believe blood in it. Pt pale and diaphoretic. Pt denies drinking alcohol. Sees GI MD for ulcers. Reports that emesis has been coffee ground at times.

## 2018-12-04 NOTE — ED Notes (Signed)
Pt reminded of the need of urine.

## 2018-12-04 NOTE — ED Provider Notes (Addendum)
[facility]   CSN: [id number] Arrival date & time: 12/04/18  1443    History   Chief Complaint Chief Complaint  Patient presents with  . Emesis    HPI Steven Burch is a 23 y.o. male with history of esophagitis, marijuana use presents to the ER for evaluation of nausea associated with multiple episodes of emesis described as "dark brown" with blood streaks in it.  Sudden onset last night after eating dinner.  States he first vomited his dinner, after that he noticed that there was some bright red blood streaks in his emesis that was dark brown.  He has been compliant with his omeprazole every day and ran out of his promethazine yesterday.  Has not been able to keep anything down for the last 12 hours.  Today he woke up and felt weak, lightheaded with standing and left upper quadrant abdominal pain described as sharp, nonradiating.  The abdominal pain has been constant but intensifies with active vomiting.  Patient states he has had intermittent bouts of nausea and vomiting in the past.  He followed up with Dr. Christella HartiganJacobs [facility] who did you EGD 6 months ago and was told he had esophagitis.  He was smoking marijuana at that time but has since stopped, last used 2 weeks ago.  He thinks his symptoms returned today because he had soda for the first time last night.  No modifying factors.   He denies any syncope, chest pain, shortness of breath, fevers, diarrhea, constipation, melena, blood in his stools.  No sick contacts.  Recently got over an upper respiratory infection, tested negative for COVID.  Does not take any NSAIDs.  No EtOH.     HPI  Past Medical History:  Diagnosis Date  . Multiple gastric ulcers     Patient Active Problem List   Diagnosis Date Noted  . Healthcare maintenance 02/26/2018  . Nausea and vomiting 02/26/2018  . Gastroesophageal reflux disease 02/26/2018    Past Surgical History:  Procedure Laterality Date  .  WRIST SURGERY Right    as a child        Home Medications    Prior to Admission medications   Medication Sig Start Date End Date Taking? Authorizing Provider  azithromycin (ZITHROMAX) 250 MG tablet Take 1 tablet (250 mg total) by mouth daily. Take first 2 tablets together, then 1 every day until finished. 11/13/18   Hall-Potvin, [place], PA-C  cetirizine (ZYRTEC) 10 MG tablet Take 1 tablet (10 mg total) by mouth daily. 11/13/18   Hall-Potvin, [place], PA-C  omeprazole (PRILOSEC) 40 MG capsule Take 1 capsule (40 mg total) by mouth daily for 30 days. 12/04/18 01/03/19  Liberty HandyGibbons, Alyshia Kernan J, PA-C  promethazine (PHENERGAN) 25 MG suppository Place 1 suppository (25 mg total) rectally every 6 (six) hours as needed for nausea or vomiting. 12/04/18   Liberty HandyGibbons, Osiris Charles J, PA-C  promethazine (PHENERGAN) 25 MG tablet Take 1 tablet (25 mg total) by mouth every 6 (six) hours as needed for nausea or vomiting. 12/04/18   Liberty HandyGibbons, Jobani Sabado J, PA-C    Family History Family History  Problem Relation Age of Onset  . Healthy Mother   . Healthy Father   . Diabetes Maternal Grandmother   . Diabetes Maternal Grandfather   . Heart attack Maternal Grandfather        mild  . Colon cancer Neg Hx   . Esophageal cancer Neg Hx   . Rectal cancer Neg Hx   .  Stomach cancer Neg Hx     Social History Social History   Tobacco Use  . Smoking status: Never Smoker  . Smokeless tobacco: Never Used  Substance Use Topics  . Alcohol use: No  . Drug use: Yes    Types: Marijuana    Comment: "cut back a lot"     Allergies   Nsaids   Review of Systems Review of Systems  Gastrointestinal: Positive for abdominal pain, nausea and vomiting.       Blood in emesis   All other systems reviewed and are negative.    Physical Exam Updated Vital Signs BP (!) 100/59   Pulse 71   Temp 97.7 F (36.5 C) (Oral)   Resp 18   SpO2 95%   Physical Exam Vitals signs and nursing note reviewed.  Constitutional:       Appearance: He is well-developed.     Comments: Actively vomiting, emesis noted to be dark brown with slight maroon tinge. Looks uncomfortable but non toxic.   HENT:     Head: Normocephalic and atraumatic.     Nose: Nose normal.     Mouth/Throat:     Comments: MMM Eyes:     Conjunctiva/sclera: Conjunctivae normal.  Neck:     Musculoskeletal: Normal range of motion.  Cardiovascular:     Rate and Rhythm: Normal rate and regular rhythm.     Heart sounds: Normal heart sounds.     Comments: SBP 75 Pulmonary:     Effort: Pulmonary effort is normal.     Breath sounds: Normal breath sounds.  Abdominal:     General: Bowel sounds are normal.     Palpations: Abdomen is soft.     Tenderness: There is abdominal tenderness.     Comments: Mild epigastric and LUQ abdominal tenderness, no G/R/R. No suprapubic or CVA tenderness. Negative Murphy's and McBurney's.  Soft, flat.  Active BS to lower quadrants.  Musculoskeletal: Normal range of motion.  Skin:    General: Skin is warm and dry.     Capillary Refill: Capillary refill takes less than 2 seconds.  Neurological:     Mental Status: He is alert.  Psychiatric:        Behavior: Behavior normal.      ED Treatments / Results  Labs (all labs ordered are listed, but only abnormal results are displayed) Labs Reviewed  COMPREHENSIVE METABOLIC PANEL - Abnormal; Notable for the following components:      Result Value   Glucose, Bld 140 (*)    All other components within normal limits  CBC WITH DIFFERENTIAL/PLATELET - Abnormal; Notable for the following components:   WBC 14.9 (*)    Neutro Abs 12.0 (*)    All other components within normal limits  POCT GASTRIC OCCULT BLOOD (1-CARD TO LAB) - Abnormal; Notable for the following components:   Occult Blood, Gastric POSITIVE (*)    All other components within normal limits  LIPASE, BLOOD  RAPID URINE DRUG SCREEN, HOSP PERFORMED  POC OCCULT BLOOD, ED    EKG None  Radiology No results found.   Procedures Procedures (including critical care time)  Medications Ordered in ED Medications  sodium chloride 0.9 % bolus 1,000 mL (0 mLs Intravenous Stopped 12/04/18 1939)    And  0.9 %  sodium chloride infusion ( Intravenous New Bag/Given 12/04/18 1738)  pantoprazole (PROTONIX) injection 40 mg (40 mg Intravenous Given 12/04/18 1703)  ondansetron (ZOFRAN) injection 4 mg (4 mg Intravenous Given 12/04/18 1702)  metoCLOPramide (REGLAN) injection  10 mg (10 mg Intravenous Given 12/04/18 1802)     Initial Impression / Assessment and Plan / ED Course  I have reviewed the triage vital signs and the nursing notes.  Pertinent labs & imaging results that were available during my care of the patient were reviewed by me and considered in my medical decision making (see chart for details).       23 year old is here with hematemesis after a forceful episode of vomiting yesterday after dinner.  Ran out of his Phenergan last night.  History of cyclical vomiting, seen by GI Dr. Christella HartiganJacobs 6 months ago.  At that time he was using marijuana but he states he has cut back 2 weeks ago.  EGD 6 months ago showed esophagitis with negative biopsies.  Exam reveals mild epigastric/LUQ tenderness without peritonitis.  Brown-tinged emesis noted.    Work up reveals WBC 14.9, afebrile likely stress response, mild dehydration. +Gastro-occult. Hgb WNL.    Ddx includes gastric/esophageal mucosal tear vs GERD vs gastritis. He is HD stable without Cp, SOB and doubt perforated ulcer.   Pt given IVF, omeprazole and antiemetics in ER. One low BP reading in the ER but this quickly improved with IV fluids.  Patient states his blood pressure is usually low.  He is only 70 kg, very active. He has no positional light headedness, pre syncope. He stopped vomiting in ER. Tolerating PO. Repeat abdominal exam improved.   1942: Discussed pt with Dr Myrtie Neitheranis who has reviewed pt's chart. Comfortable with discharge with omeprazole and antiemetics,  close f/u with Dr Christella HartiganJacobs, marijuana cessation. This was discussed with patient who is comfortable with ER POC and discharge plan. Return precautions given.  Final Clinical Impressions(s) / ED Diagnoses   Final diagnoses:  Hematemesis with nausea    ED Discharge Orders         Ordered    promethazine (PHENERGAN) 25 MG suppository  Every 6 hours PRN     12/04/18 1936    promethazine (PHENERGAN) 25 MG tablet  Every 6 hours PRN     12/04/18 1936    omeprazole (PRILOSEC) 40 MG capsule  Daily     12/04/18 1936           Jerrell MylarGibbons, Cathy Ropp J, PA-C 12/04/18 1944    Rolan BuccoBelfi, Melanie, MD 12/04/18 2328

## 2018-12-26 ENCOUNTER — Emergency Department (HOSPITAL_COMMUNITY)
Admission: EM | Admit: 2018-12-26 | Discharge: 2018-12-26 | Disposition: A | Discharge status: Home / Self Care | Payer: Medicaid Other | Attending: Emergency Medicine | Admitting: Emergency Medicine

## 2018-12-26 ENCOUNTER — Other Ambulatory Visit: Payer: Self-pay

## 2018-12-26 ENCOUNTER — Encounter (HOSPITAL_COMMUNITY): Payer: Self-pay

## 2018-12-26 DIAGNOSIS — R109 Unspecified abdominal pain: Secondary | ICD-10-CM | POA: Insufficient documentation

## 2018-12-26 DIAGNOSIS — R112 Nausea with vomiting, unspecified: Secondary | ICD-10-CM | POA: Insufficient documentation

## 2018-12-26 LAB — CBC
HCT: 48.1 % (ref 39.0–52.0)
Hemoglobin: 16.2 g/dL (ref 13.0–17.0)
MCH: 29 pg (ref 26.0–34.0)
MCHC: 33.7 g/dL (ref 30.0–36.0)
MCV: 86.2 fL (ref 80.0–100.0)
Platelets: 298 10*3/uL (ref 150–400)
RBC: 5.58 MIL/uL (ref 4.22–5.81)
RDW: 13.2 % (ref 11.5–15.5)
WBC: 12.4 10*3/uL — ABNORMAL HIGH (ref 4.0–10.5)
nRBC: 0 % (ref 0.0–0.2)

## 2018-12-26 LAB — COMPREHENSIVE METABOLIC PANEL
ALT: 18 U/L (ref 0–44)
AST: 22 U/L (ref 15–41)
Albumin: 5.1 g/dL — ABNORMAL HIGH (ref 3.5–5.0)
Alkaline Phosphatase: 68 U/L (ref 38–126)
Anion gap: 13 (ref 5–15)
BUN: 9 mg/dL (ref 6–20)
CO2: 22 mmol/L (ref 22–32)
Calcium: 10.3 mg/dL (ref 8.9–10.3)
Chloride: 105 mmol/L (ref 98–111)
Creatinine, Ser: 0.93 mg/dL (ref 0.61–1.24)
GFR calc Af Amer: >60 mL/min (ref >60)
GFR calc non Af Amer: >60 mL/min (ref >60)
Glucose, Bld: 138 mg/dL — ABNORMAL HIGH (ref 70–99)
Potassium: 3.8 mmol/L (ref 3.5–5.1)
Sodium: 140 mmol/L (ref 135–145)
Total Bilirubin: 1.2 mg/dL (ref 0.3–1.2)
Total Protein: 8.5 g/dL — ABNORMAL HIGH (ref 6.5–8.1)

## 2018-12-26 LAB — LIPASE, BLOOD: Lipase: 27 U/L (ref 11–51)

## 2018-12-26 MED ORDER — SODIUM CHLORIDE 0.9% FLUSH: 3.0000 mL | Freq: Once | INTRAVENOUS | Status: DC

## 2018-12-26 MED ORDER — PROMETHAZINE HCL 25 MG PO TABS: 25.0000 mg | ORAL_TABLET | Freq: Four times a day (QID) | ORAL | 0 refills | Status: DC | PRN

## 2018-12-26 MED ORDER — SODIUM CHLORIDE 0.9 % IV BOLUS (SEPSIS)
1000.0000 mL | Freq: Once | INTRAVENOUS | Status: AC
  Administered 2018-12-26: 1000 mL via INTRAVENOUS

## 2018-12-26 MED ORDER — HALOPERIDOL LACTATE 5 MG/ML IJ SOLN
3.0000 mg | Freq: Once | INTRAMUSCULAR | Status: AC
  Administered 2018-12-26: 3 mg via INTRAVENOUS
  Filled 2018-12-26: qty 1

## 2018-12-26 MED ORDER — SODIUM CHLORIDE 0.9 % IV SOLN
80.0000 mg | Freq: Once | INTRAVENOUS | Status: AC
  Administered 2018-12-26: 80 mg via INTRAVENOUS
  Filled 2018-12-26: qty 80

## 2018-12-26 MED ORDER — SODIUM CHLORIDE 0.9 % IV BOLUS
1000.0000 mL | Freq: Once | INTRAVENOUS | Status: AC
  Administered 2018-12-26: 1000 mL via INTRAVENOUS

## 2018-12-26 MED ORDER — ONDANSETRON HCL 4 MG/2ML IJ SOLN
4.0000 mg | Freq: Once | INTRAMUSCULAR | Status: AC | PRN
  Administered 2018-12-26: 13:00:00 4 mg via INTRAVENOUS
  Filled 2018-12-26: qty 2

## 2018-12-26 MED ORDER — PROMETHAZINE HCL 25 MG/ML IJ SOLN
12.5000 mg | Freq: Once | INTRAMUSCULAR | Status: AC
  Administered 2018-12-26: 12.5 mg via INTRAVENOUS
  Filled 2018-12-26: qty 1

## 2018-12-26 NOTE — ED Notes (Signed)
Dr. Schlossman at bedside. 

## 2018-12-26 NOTE — ED Notes (Signed)
Pt given sprite 

## 2018-12-26 NOTE — ED Triage Notes (Addendum)
Per pt: He started feeling badly yesterday. Pt tried to take his medication this morning but threw up the pills. Pt dry heaving and vomiting in triage. States last marijuana use was 1 month ago. Denies any known exposure to triggers to emesis. Pt endorses epigastric pain. Denies any blood in emesis, urine, or stool.

## 2018-12-26 NOTE — ED Notes (Signed)
Pt is feeling better, would like to go home, EDP made aware

## 2018-12-26 NOTE — ED Notes (Signed)
This NT talked with pt about urine sample, pt currently vomiting and stated can't urinate feels dehydrated. Pt given warm blankets. Pt resting.

## 2018-12-26 NOTE — ED Provider Notes (Signed)
Emergency Department Provider Note   I have reviewed the triage vital signs and the nursing notes.   HISTORY  Chief Complaint Emesis   HPI Steven Burch is a 23 y.o. male with past medical history of gastritis and intermittent vomiting episodes presents to the emergency department with mid abdominal pain, nausea, vomiting.  He is had multiple episodes of vomiting throughout the night and this morning.  He is tried to take his home medications but vomits them up almost immediately.  He states that he continues to be abstinent from marijuana now for over 1 month.  He does not drink alcohol.  He states that this episode feels similar to his prior flares of vomiting abdominal pain.  Patient had EGD approximately 7 months ago which showed gastritis.  He follows with [facility].  He has tried aluminating other possible triggers from his life including caffeine with no relief.  Denies fever.  He states that his abdominal pain feels crampy and worse with vomiting.  No chest pain or shortness of breath.  No radiation of symptoms or other modifying factors.   Past Medical History:  Diagnosis Date  . Multiple gastric ulcers     Patient Active Problem List   Diagnosis Date Noted  . Healthcare maintenance 02/26/2018  . Nausea and vomiting 02/26/2018  . Gastroesophageal reflux disease 02/26/2018    Past Surgical History:  Procedure Laterality Date  . WRIST SURGERY Right    as a child    Allergies Nsaids  Family History  Problem Relation Age of Onset  . Healthy Mother   . Healthy Father   . Diabetes Maternal Grandmother   . Diabetes Maternal Grandfather   . Heart attack Maternal Grandfather        mild  . Colon cancer Neg Hx   . Esophageal cancer Neg Hx   . Rectal cancer Neg Hx   . Stomach cancer Neg Hx     Social History Social History   Tobacco Use  . Smoking status: Never Smoker  . Smokeless tobacco: Never Used  Substance Use Topics  . Alcohol use: No  . Drug use:  Yes    Types: Marijuana    Comment: "cut back a lot"    Review of Systems  Constitutional: No fever/chills Eyes: No visual changes. ENT: No sore throat. Cardiovascular: Denies chest pain. Respiratory: Denies shortness of breath. Gastrointestinal: Positive abdominal pain. Positive nausea and vomiting.  No diarrhea.  No constipation. Genitourinary: Negative for dysuria. Musculoskeletal: Negative for back pain. Skin: Negative for rash. Neurological: Negative for headaches, focal weakness or numbness.  10-point ROS otherwise negative.  ____________________________________________   PHYSICAL EXAM:  VITAL SIGNS: ED Triage Vitals  Enc Vitals Group     BP 12/26/18 1215 133/86     Pulse Rate 12/26/18 1216 83     Resp 12/26/18 1216 14     Temp 12/26/18 1216 98.2 F (36.8 C)     Temp Source 12/26/18 1216 Oral     SpO2 12/26/18 1216 99 %   Constitutional: Alert and oriented. Providing full history but appears uncomfortable. Diaphoresis noted.  Eyes: Conjunctivae are normal. Head: Atraumatic. Nose: No congestion/rhinnorhea. Mouth/Throat: Mucous membranes are moist.   Neck: No stridor.  Cardiovascular: Normal rate, regular rhythm. Good peripheral circulation. Grossly normal heart sounds.   Respiratory: Normal respiratory effort.  No retractions. Lungs CTAB. Gastrointestinal: Soft with mild diffuse tenderness. No distention.  Musculoskeletal: No lower extremity tenderness nor edema. No gross deformities of extremities. Neurologic:  Normal speech and language. No gross focal neurologic deficits are appreciated.  Skin:  Skin is warm, dry and intact. No rash noted.  ____________________________________________   LABS (all labs ordered are listed, but only abnormal results are displayed)  Labs Reviewed  COMPREHENSIVE METABOLIC PANEL - Abnormal; Notable for the following components:      Result Value   Glucose, Bld 138 (*)    Total Protein 8.5 (*)    Albumin 5.1 (*)    All  other components within normal limits  CBC - Abnormal; Notable for the following components:   WBC 12.4 (*)    All other components within normal limits  LIPASE, BLOOD   ____________________________________________  RADIOLOGY  None  ____________________________________________   PROCEDURES  Procedure(s) performed:   Procedures  None  ____________________________________________   INITIAL IMPRESSION / ASSESSMENT AND PLAN / ED COURSE  Pertinent labs & imaging results that were available during my care of the patient were reviewed by me and considered in my medical decision making (see chart for details).   Patient presents to the emergency department for evaluation of abdominal pain with nausea vomiting.  Mild tenderness on exam.  This seems consistent with his prior episodes of similar pain/vomiting.  Will defer abdominal imaging at this time.  Plan for screening lab work, IV fluids, symptom management.  Doubt acute process in the abdomen given history and my relatively benign exam.   Patient lab work reviewed.  Not feeling significantly better after Zofran and fluids.  I have added Protonix and Phenergan.  Care transferred to Dr. Dalene SeltzerSchlossman regarding follow-up on the patient after additional medication. Anticipate discharge ultimately.  ____________________________________________  FINAL CLINICAL IMPRESSION(S) / ED DIAGNOSES  Final diagnoses:  Nausea and vomiting, intractability of vomiting not specified, unspecified vomiting type     MEDICATIONS GIVEN DURING THIS VISIT:  Medications  ondansetron (ZOFRAN) injection 4 mg (4 mg Intravenous Given 12/26/18 1230)  sodium chloride 0.9 % bolus 1,000 mL (0 mLs Intravenous Stopped 12/26/18 1407)  promethazine (PHENERGAN) injection 12.5 mg (12.5 mg Intravenous Given 12/26/18 1405)  pantoprazole (PROTONIX) 80 mg in sodium chloride 0.9 % 100 mL IVPB (0 mg Intravenous Stopped 12/26/18 1652)  haloperidol lactate (HALDOL) injection 3 mg  (3 mg Intravenous Given 12/26/18 1913)  sodium chloride 0.9 % bolus 1,000 mL (0 mLs Intravenous Stopped 12/26/18 1946)    Note:  This document was prepared using Dragon voice recognition software and may include unintentional dictation errors.  Alona BeneJoshua Caelen Reierson, MD Emergency Medicine    Tyrail Grandfield, Arlyss RepressJoshua G, MD 12/27/18 [phone]

## 2018-12-26 NOTE — ED Notes (Signed)
Patient aware that we need urine sample for testing, unable at this time. Pt given instruction on providing urine sample when able to do so.   

## 2018-12-27 NOTE — ED Provider Notes (Signed)
  Physical Exam  BP (!) 101/54   Pulse 99   Temp 98.2 F (36.8 C) (Oral)   Resp 19   SpO2 98%   Physical Exam  ED Course/Procedures     Procedures  MDM  Received care of pt from Dr. Laverta Baltimore at [facility]. Briefly, this is a 23yo male who presented with abdominal pain, nausea and vomiting.  History of similar episodes in the past.  Still having emesis after medications given but reports improvement.   Given haldol with improvement in symptoms. Tolerating po. Given rx for phenergan and recommend outpt follow up.  Patient discharged in stable condition with understanding of reasons to return.        Gareth Morgan, MD 12/28/18 1620

## 2019-01-11 ENCOUNTER — Other Ambulatory Visit: Payer: Self-pay

## 2019-01-11 ENCOUNTER — Encounter (HOSPITAL_COMMUNITY): Payer: Self-pay | Admitting: Emergency Medicine

## 2019-01-11 ENCOUNTER — Emergency Department (HOSPITAL_COMMUNITY)
Admission: EM | Admit: 2019-01-11 | Discharge: 2019-01-11 | Disposition: A | Discharge status: Home / Self Care | Payer: Medicaid Other | Attending: Emergency Medicine | Admitting: Emergency Medicine

## 2019-01-11 DIAGNOSIS — R112 Nausea with vomiting, unspecified: Secondary | ICD-10-CM | POA: Insufficient documentation

## 2019-01-11 DIAGNOSIS — R1013 Epigastric pain: Secondary | ICD-10-CM | POA: Insufficient documentation

## 2019-01-11 DIAGNOSIS — Z79899 Other long term (current) drug therapy: Secondary | ICD-10-CM | POA: Insufficient documentation

## 2019-01-11 DIAGNOSIS — R10817 Generalized abdominal tenderness: Secondary | ICD-10-CM | POA: Insufficient documentation

## 2019-01-11 LAB — CBC WITH DIFFERENTIAL/PLATELET
Abs Immature Granulocytes: 0.05 10*3/uL (ref 0.00–0.07)
Basophils Absolute: 0 10*3/uL (ref 0.0–0.1)
Basophils Relative: 0 %
Eosinophils Absolute: 0 10*3/uL (ref 0.0–0.5)
Eosinophils Relative: 0 %
HCT: 46.8 % (ref 39.0–52.0)
Hemoglobin: 15.5 g/dL (ref 13.0–17.0)
Immature Granulocytes: 0 %
Lymphocytes Relative: 14 %
Lymphs Abs: 1.7 10*3/uL (ref 0.7–4.0)
MCH: 29.2 pg (ref 26.0–34.0)
MCHC: 33.1 g/dL (ref 30.0–36.0)
MCV: 88.1 fL (ref 80.0–100.0)
Monocytes Absolute: 0.4 10*3/uL (ref 0.1–1.0)
Monocytes Relative: 4 %
Neutro Abs: 10.1 10*3/uL — ABNORMAL HIGH (ref 1.7–7.7)
Neutrophils Relative %: 82 %
Platelets: 244 10*3/uL (ref 150–400)
RBC: 5.31 MIL/uL (ref 4.22–5.81)
RDW: 13.2 % (ref 11.5–15.5)
WBC: 12.3 10*3/uL — ABNORMAL HIGH (ref 4.0–10.5)
nRBC: 0 % (ref 0.0–0.2)

## 2019-01-11 LAB — LIPASE, BLOOD: Lipase: 24 U/L (ref 11–51)

## 2019-01-11 LAB — URINALYSIS, ROUTINE W REFLEX MICROSCOPIC
Bilirubin Urine: NEGATIVE
Glucose, UA: NEGATIVE mg/dL
Hgb urine dipstick: NEGATIVE
Ketones, ur: NEGATIVE mg/dL
Leukocytes,Ua: NEGATIVE
Nitrite: NEGATIVE
Protein, ur: NEGATIVE mg/dL
Specific Gravity, Urine: 1.023 (ref 1.005–1.030)
pH: 5 (ref 5.0–8.0)

## 2019-01-11 LAB — COMPREHENSIVE METABOLIC PANEL
ALT: 14 U/L (ref 0–44)
AST: 22 U/L (ref 15–41)
Albumin: 4.9 g/dL (ref 3.5–5.0)
Alkaline Phosphatase: 69 U/L (ref 38–126)
Anion gap: 10 (ref 5–15)
BUN: 9 mg/dL (ref 6–20)
CO2: 24 mmol/L (ref 22–32)
Calcium: 9.6 mg/dL (ref 8.9–10.3)
Chloride: 106 mmol/L (ref 98–111)
Creatinine, Ser: 1.07 mg/dL (ref 0.61–1.24)
GFR calc Af Amer: >60 mL/min (ref >60)
GFR calc non Af Amer: >60 mL/min (ref >60)
Glucose, Bld: 130 mg/dL — ABNORMAL HIGH (ref 70–99)
Potassium: 3.7 mmol/L (ref 3.5–5.1)
Sodium: 140 mmol/L (ref 135–145)
Total Bilirubin: 0.8 mg/dL (ref 0.3–1.2)
Total Protein: 7.4 g/dL (ref 6.5–8.1)

## 2019-01-11 LAB — RAPID URINE DRUG SCREEN, HOSP PERFORMED
Amphetamines: NOT DETECTED
Barbiturates: NOT DETECTED
Benzodiazepines: NOT DETECTED
Cocaine: NOT DETECTED
Opiates: NOT DETECTED
Tetrahydrocannabinol: POSITIVE — AB

## 2019-01-11 MED ORDER — ONDANSETRON HCL 4 MG/2ML IJ SOLN
4.0000 mg | Freq: Once | INTRAMUSCULAR | Status: AC
  Administered 2019-01-11: 4 mg via INTRAVENOUS
  Filled 2019-01-11: qty 2

## 2019-01-11 MED ORDER — FAMOTIDINE IN NACL 20-0.9 MG/50ML-% IV SOLN
20.0000 mg | Freq: Once | INTRAVENOUS | Status: AC
  Administered 2019-01-11: 20 mg via INTRAVENOUS
  Filled 2019-01-11: qty 50

## 2019-01-11 MED ORDER — PROMETHAZINE HCL 25 MG/ML IJ SOLN
12.5000 mg | Freq: Once | INTRAMUSCULAR | Status: AC
  Administered 2019-01-11: 12.5 mg via INTRAVENOUS
  Filled 2019-01-11: qty 1

## 2019-01-11 MED ORDER — PROMETHAZINE HCL 25 MG PO TABS: 25.0000 mg | ORAL_TABLET | Freq: Three times a day (TID) | ORAL | 0 refills | Status: AC | PRN

## 2019-01-11 MED ORDER — ALUM & MAG HYDROXIDE-SIMETH 200-200-20 MG/5ML PO SUSP
30.0000 mL | Freq: Once | ORAL | Status: DC
  Filled 2019-01-11: qty 30

## 2019-01-11 MED ORDER — HALOPERIDOL LACTATE 5 MG/ML IJ SOLN
2.5000 mg | Freq: Once | INTRAMUSCULAR | Status: AC
  Administered 2019-01-11: 2.5 mg via INTRAVENOUS
  Filled 2019-01-11: qty 1

## 2019-01-11 MED ORDER — SODIUM CHLORIDE 0.9 % IV BOLUS
1000.0000 mL | Freq: Once | INTRAVENOUS | Status: AC
  Administered 2019-01-11: 1000 mL via INTRAVENOUS

## 2019-01-11 MED ORDER — LIDOCAINE VISCOUS HCL 2 % MT SOLN
15.0000 mL | Freq: Once | OROMUCOSAL | Status: DC
  Filled 2019-01-11: qty 15

## 2019-01-11 MED ORDER — HALOPERIDOL LACTATE 5 MG/ML IJ SOLN: 5.0000 mg | Freq: Once | INTRAMUSCULAR | Status: DC

## 2019-01-11 NOTE — ED Triage Notes (Signed)
abd pain and vomiting since last night states has meds to be picked up but did not have $ till today ate bad pizza yesterday

## 2019-01-11 NOTE — ED Provider Notes (Signed)
[facility] Provider Note   CSN: [id number] Arrival date & time: 01/11/19  1017    History   Chief Complaint Chief Complaint  Patient presents with  . Vomiting  . Abdominal Pain    HPI Lucas MallowChandler L Ahuja is a 23 y.o. male with history of esophagitis, gastritis presents for evaluation of acute onset, persistent nausea vomiting and upper abdominal pain since last night.  Reports symptoms began after eating gas station pizza.  Has a history of esophagitis and gastritis seen on EGD followed by with our gastroenterology.  Has had multiple ED visits for persistent nausea and vomiting.  Tells me this feels similar.  He reports burning epigastric abdominal pain which does not radiate.  Becomes sharp with emesis.  Improves laying in bed in the fetal position.  Denies hematemesis, urinary symptoms, diarrhea, constipation but has not had a bowel movement since yesterday.  No fevers or chills, no chest pain or shortness of breath.  Has not tried anything for his symptoms.  Reports he has prescriptions for Zofran and Phenergan at his pharmacy but did not have any money to pay for them until today.  He tells me that he last smoked marijuana and drank alcohol over 1 month ago, denies cigarette smoking.     The history is provided by the patient.    Past Medical History:  Diagnosis Date  . Multiple gastric ulcers     Patient Active Problem List   Diagnosis Date Noted  . Healthcare maintenance 02/26/2018  . Nausea and vomiting 02/26/2018  . Gastroesophageal reflux disease 02/26/2018    Past Surgical History:  Procedure Laterality Date  . WRIST SURGERY Right    as a child        Home Medications    Prior to Admission medications   Medication Sig Start Date End Date Taking? Authorizing Provider  azithromycin (ZITHROMAX) 250 MG tablet Take 1 tablet (250 mg total) by mouth daily. Take first 2 tablets together, then 1 every day until finished. Patient  not taking: Reported on 12/26/2018 11/13/18   Hall-Potvin, Grenada[place]ittany, PA-C  cetirizine (ZYRTEC) 10 MG tablet Take 1 tablet (10 mg total) by mouth daily. 11/13/18   Hall-Potvin, [place]ittany, PA-C  omeprazole (PRILOSEC) 40 MG capsule Take 1 capsule (40 mg total) by mouth daily for 30 days. 12/04/18 01/03/19  Liberty HandyGibbons, Claudia J, PA-C  promethazine (PHENERGAN) 25 MG suppository Place 1 suppository (25 mg total) rectally every 6 (six) hours as needed for nausea or vomiting. Patient not taking: Reported on 12/26/2018 12/04/18   Liberty HandyGibbons, Claudia J, PA-C  promethazine (PHENERGAN) 25 MG tablet Take 1 tablet (25 mg total) by mouth every 6 (six) hours as needed for nausea or vomiting. 12/26/18   Alvira MondaySchlossman, Erin, MD    Family History Family History  Problem Relation Age of Onset  . Healthy Mother   . Healthy Father   . Diabetes Maternal Grandmother   . Diabetes Maternal Grandfather   . Heart attack Maternal Grandfather        mild  . Colon cancer Neg Hx   . Esophageal cancer Neg Hx   . Rectal cancer Neg Hx   . Stomach cancer Neg Hx     Social History Social History   Tobacco Use  . Smoking status: Never Smoker  . Smokeless tobacco: Never Used  Substance Use Topics  . Alcohol use: No  . Drug use: Yes    Types: Marijuana    Comment: "cut back a lot"  Allergies   Nsaids   Review of Systems Review of Systems  Constitutional: Negative for chills and fever.  Respiratory: Negative for shortness of breath.   Cardiovascular: Negative for chest pain.  Gastrointestinal: Positive for abdominal pain, nausea and vomiting. Negative for constipation and diarrhea.  Genitourinary: Negative for dysuria, hematuria and urgency.  All other systems reviewed and are negative.    Physical Exam Updated Vital Signs BP 124/75 (BP Location: Left Arm)   Pulse (!) 50   Temp 98.6 F (37 C) (Oral)   Resp 18   Ht 5\' 8"  (1.727 m)   Wt 70.3 kg   SpO2 99%   BMI 23.57 kg/m   Physical Exam Vitals signs and  nursing note reviewed.  Constitutional:      General: He is not in acute distress.    Appearance: He is well-developed. He is diaphoretic.     Comments: Sitting upright, appears uncomfortable  HENT:     Head: Normocephalic and atraumatic.  Eyes:     General:        Right eye: No discharge.        Left eye: No discharge.     Conjunctiva/sclera: Conjunctivae normal.  Neck:     Vascular: No JVD.     Trachea: No tracheal deviation.  Cardiovascular:     Rate and Rhythm: Normal rate and regular rhythm.     Heart sounds: Normal heart sounds.  Pulmonary:     Effort: Pulmonary effort is normal.     Breath sounds: Normal breath sounds.  Abdominal:     General: Abdomen is flat. Bowel sounds are normal. There is no distension.     Palpations: Abdomen is soft.     Tenderness: There is generalized abdominal tenderness and tenderness in the epigastric area. There is no right CVA tenderness, left CVA tenderness, guarding or rebound. Negative signs include Murphy's sign.     Comments: Mild generalized soreness, maximally tender to palpation in the epigastric region  Skin:    General: Skin is warm.     Findings: No erythema.  Neurological:     Mental Status: He is alert.  Psychiatric:        Behavior: Behavior normal.      ED Treatments / Results  Labs (all labs ordered are listed, but only abnormal results are displayed) Labs Reviewed  CBC WITH DIFFERENTIAL/PLATELET - Abnormal; Notable for the following components:      Result Value   WBC 12.3 (*)    Neutro Abs 10.1 (*)    All other components within normal limits  COMPREHENSIVE METABOLIC PANEL - Abnormal; Notable for the following components:   Glucose, Bld 130 (*)    All other components within normal limits  RAPID URINE DRUG SCREEN, HOSP PERFORMED - Abnormal; Notable for the following components:   Tetrahydrocannabinol POSITIVE (*)    All other components within normal limits  LIPASE, BLOOD  URINALYSIS, ROUTINE W REFLEX  MICROSCOPIC    EKG None  Radiology No results found.  Procedures Procedures (including critical care time)  Medications Ordered in ED Medications  alum & mag hydroxide-simeth (MAALOX/MYLANTA) 200-200-20 MG/5ML suspension 30 mL (has no administration in time range)    And  lidocaine (XYLOCAINE) 2 % viscous mouth solution 15 mL (has no administration in time range)  sodium chloride 0.9 % bolus 1,000 mL (1,000 mLs Intravenous New Bag/Given 01/11/19 1140)  ondansetron (ZOFRAN) injection 4 mg (4 mg Intravenous Given 01/11/19 1140)  promethazine (PHENERGAN) injection 12.5 mg (12.5  mg Intravenous Given 01/11/19 1340)  famotidine (PEPCID) IVPB 20 mg premix (0 mg Intravenous Stopped 01/11/19 1505)  haloperidol lactate (HALDOL) injection 2.5 mg (2.5 mg Intravenous Given 01/11/19 1535)     Initial Impression / Assessment and Plan / ED Course  I have reviewed the triage vital signs and the nursing notes.  Pertinent labs & imaging results that were available during my care of the patient were reviewed by me and considered in my medical decision making (see chart for details).        Patient with history of gastritis and esophagitis complicated with recurrent bouts of nausea and vomiting presents for the same.  He is afebrile, vital signs are stable.  He is nontoxic in appearance.  His abdominal examination is benign and I doubt acute surgical abdominal pathology given his symptoms are consistent with his usual presentation and his physical examination is reassuring.  Will obtain blood work and give IV fluids and antiemetics.  Labs reviewed by me show nonspecific leukocytosis which appears to be chronic compared to prior labs, no anemia, no metabolic derangements, no renal insufficiency.  LFTs and lipase are within normal limits.  UA does not suggest UTI or nephrolithiasis and also has not significantly concerning for dehydration with normal specific gravity and no ketonuria or proteinuria.  His UDS  is again positive for [facility] though he tells me that he has not ingested marijuana in the last month or so.  Possible hyperemesis cannabinoid syndrome contributing to his symptoms today.  He was initially given Zofran and IV fluids without improvement in his nausea so then was given Phenergan and Pepcid.  On reassessment reports ongoing nausea and was unable to tolerate p.o.  Or GI cocktail.  Will give Haldol reassess.  3:57 PM Patient resting comfortably in no apparent distress.  Reports that he is feeling much better.  He was able to tolerate p.o. fluids without difficulty.  He thinks he has prescriptions for Zofran and Phenergan at his pharmacy but is unsure if they have expired.  He does not have an appointment to see his gastroenterologist but I encouraged him to call to make a follow-up appointment soon as possible for management of symptoms.  Discussed advancing diet slowly, pushing fluids.  Discussed strict ED return precautions. Patient verbalized understanding of and agreement with plan and is safe for discharge home at this time.   Final Clinical Impressions(s) / ED Diagnoses   Final diagnoses:  Epigastric pain  Non-intractable vomiting with nausea, unspecified vomiting type    ED Discharge Orders    None       Renita Papa, PA-C 01/11/19 [place], [place], DO 01/11/19 1701

## 2019-01-11 NOTE — ED Notes (Signed)
Pt notified we need a urine sample unable to go at this time 

## 2019-01-11 NOTE — Discharge Instructions (Signed)
1. Medications: Take Pepcid twice daily with meals.  Continue to take Prilosec.  Take Phenergan as needed for nausea.  Wait around 20 minutes before eating or drinking after taking this medication. 2. Treatment: rest, drink plenty of fluids, advance diet slowly.  Start with water and broth then advance to bland foods that will not upset your stomach such as crackers, mashed potatoes, and peanut butter. 3. Follow Up: Please followup with your primary doctor or gastroenterologist in 3 days for discussion of your diagnoses and further evaluation after today's visit; Please return to the ER for persistent vomiting, high fevers or worsening symptoms

## 2019-01-11 NOTE — ED Notes (Signed)
Pt given 2 cups of ice water.  

## 2019-03-06 IMAGING — CT CT ABD-PELV W/ CM
2 of 4 series · 17 of 46 positions shown, 19 images · IV contrast (APPLIED)
Comparison: Ultrasound 10/24/2015

CLINICAL DATA: Abdominal pain, fever, nausea, vomiting

EXAM:
CT ABDOMEN AND PELVIS WITH CONTRAST
TECHNIQUE: Multidetector CT imaging of the abdomen and pelvis was performed
using the standard protocol following bolus administration of
intravenous contrast.
CONTRAST:  100mL 17OHGA-G66 IOPAMIDOL (17OHGA-G66) INJECTION 61%

[Series 3: abd/ pelvis 5.0 i30f 2 · axial · 0.71mm/px · z∈[+818,+1238]mm · 14 of 92 slices shown, 16 images]
[im 4/92  soft-tissue]
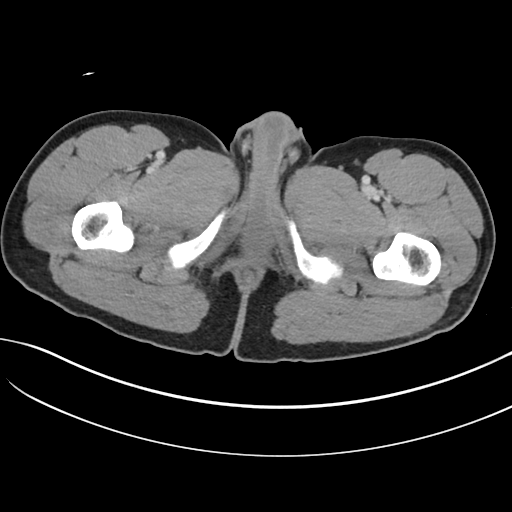
[im 4/92  bone]
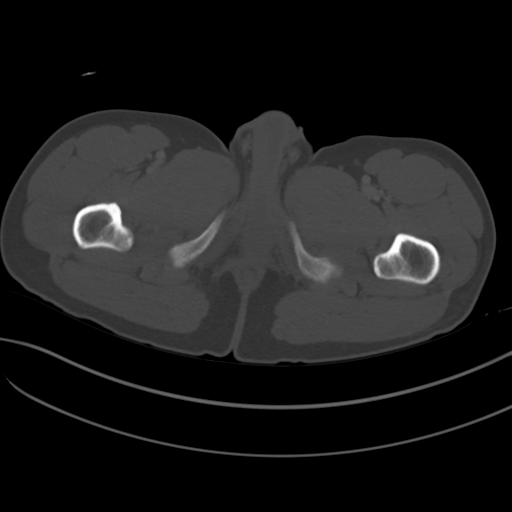
[im 11/92  soft-tissue]
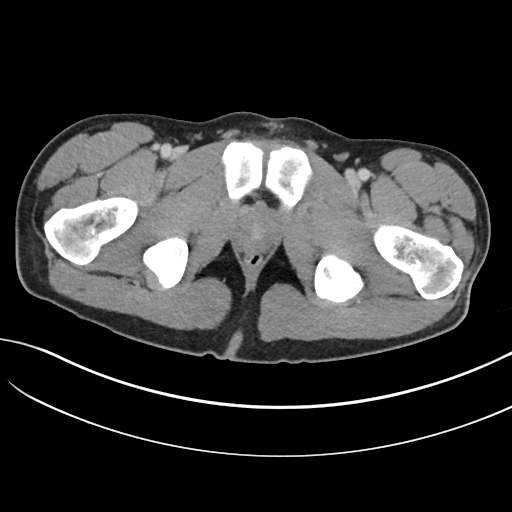
[im 19/92  soft-tissue]
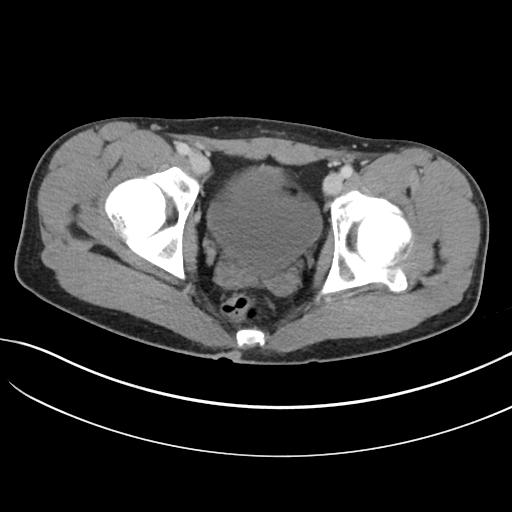
[im 26/92  soft-tissue]
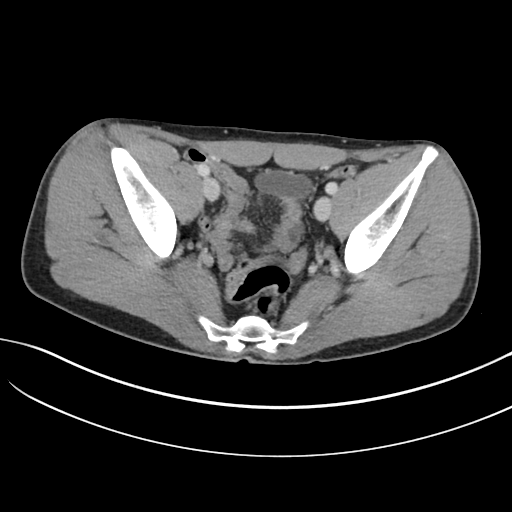
[im 30/92  soft-tissue]
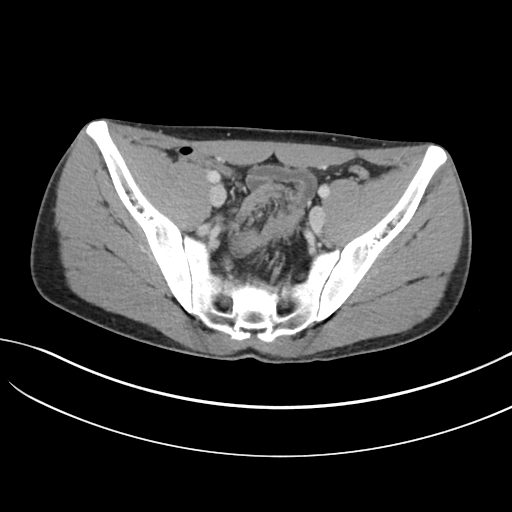
[im 37/92  soft-tissue]
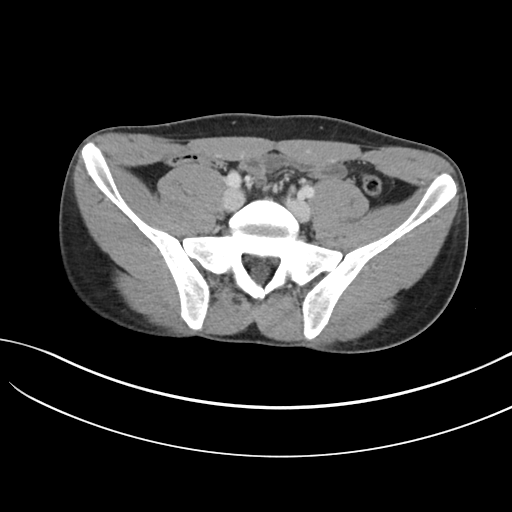
[im 44/92  soft-tissue]
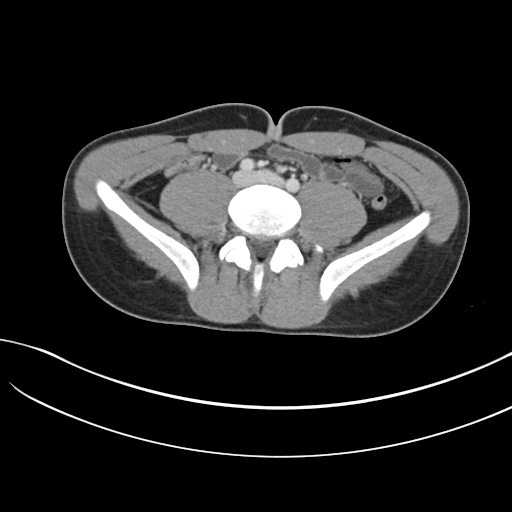
[im 48/92  soft-tissue]
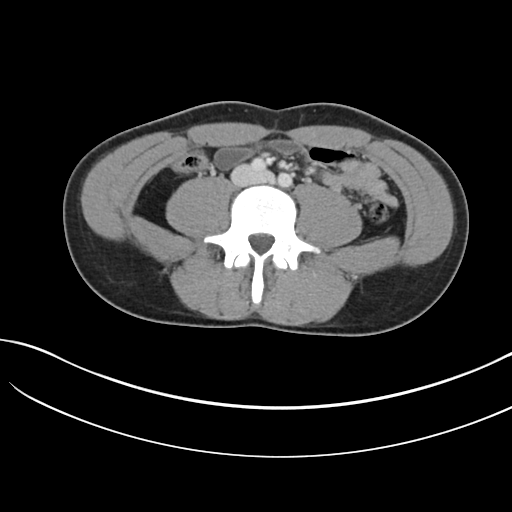
[im 55/92  soft-tissue]
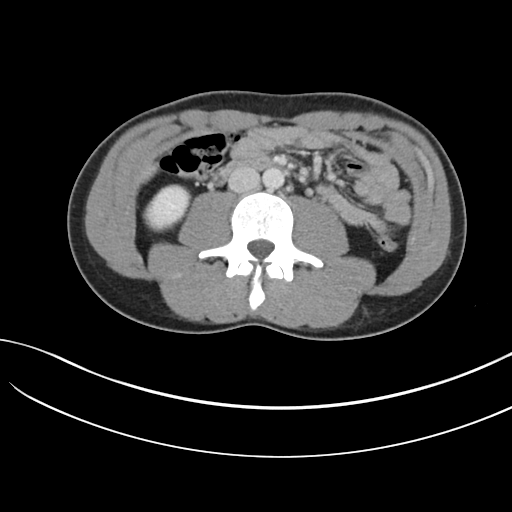
[im 55/92  bone]
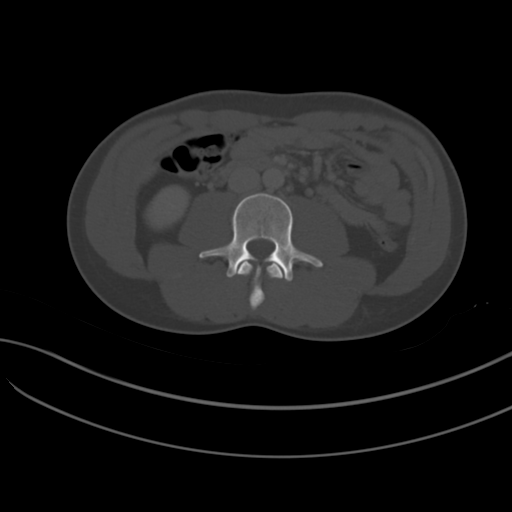
[im 62/92  soft-tissue]
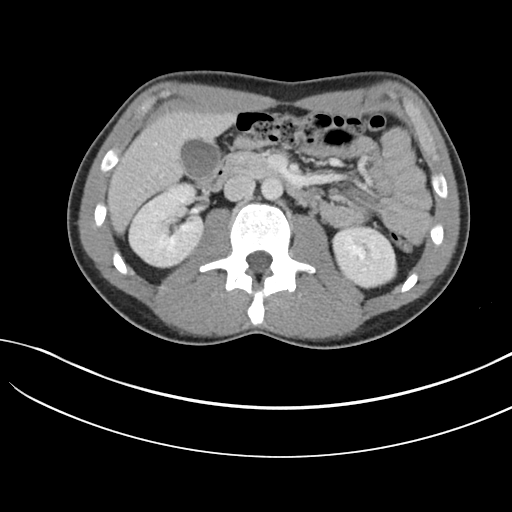
[im 70/92  soft-tissue]
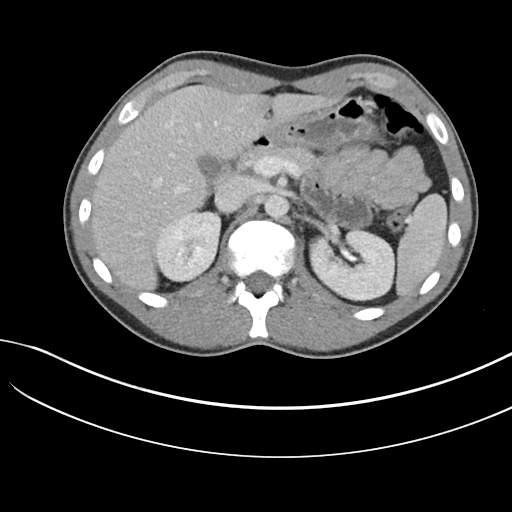
[im 73/92  soft-tissue]
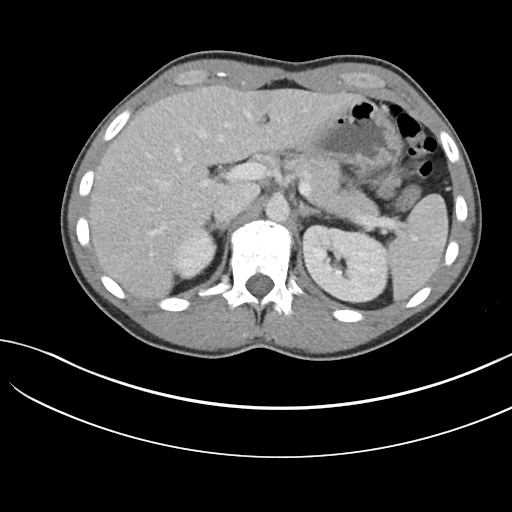
[im 81/92  soft-tissue]
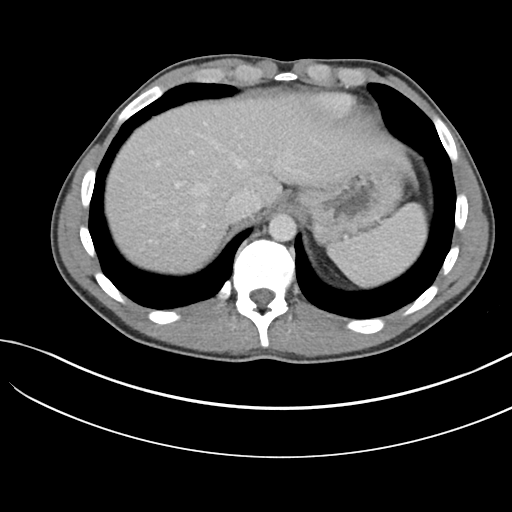
[im 88/92  soft-tissue]
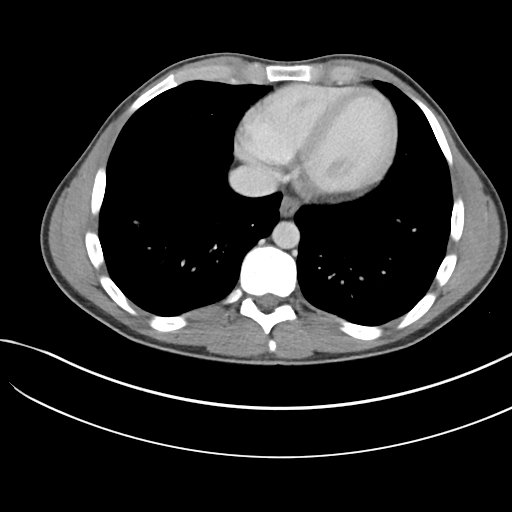

[Series 6: coronal soft tissue · coronal · 0.76mm/px · 3 of 72 slices shown]
[im 24/72  soft-tissue]
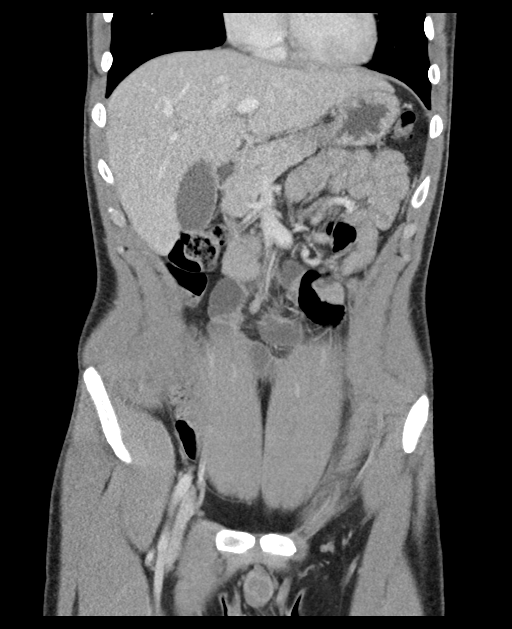
[im 32/72  soft-tissue]
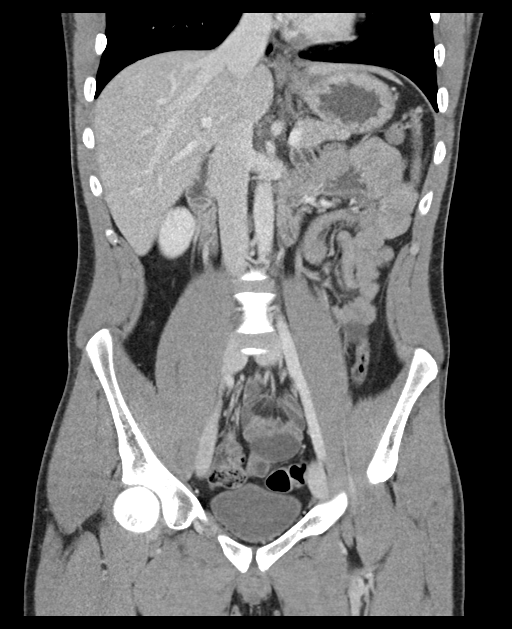
[im 40/72  soft-tissue]
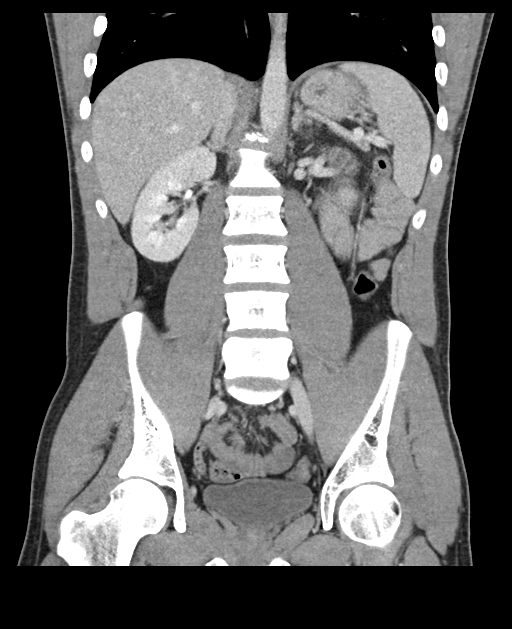

[17 of 46 positions shown; findings below may reference images not displayed]

FINDINGS: Lower chest: Lung bases are clear. No effusions. Heart is normal
size.

Hepatobiliary: No focal hepatic abnormality. Gallbladder
unremarkable.

Pancreas: No focal abnormality or ductal dilatation.

Spleen: No focal abnormality.  Normal size.

Adrenals/Urinary Tract: No adrenal abnormality. No focal renal
abnormality. No stones or hydronephrosis. Urinary bladder is
unremarkable.

Stomach/Bowel: Appendix is normal. Stomach, large and small bowel
grossly unremarkable.

Vascular/Lymphatic: No evidence of aneurysm or adenopathy.

Reproductive: No visible focal abnormality.

Other: No free fluid or free air.

Musculoskeletal: No bony abnormality.
IMPRESSION: No acute findings in the abdomen or pelvis.  PICC

## 2019-07-10 ENCOUNTER — Encounter (HOSPITAL_COMMUNITY): Payer: Self-pay

## 2019-07-10 ENCOUNTER — Other Ambulatory Visit: Payer: Self-pay

## 2019-07-10 ENCOUNTER — Ambulatory Visit (HOSPITAL_COMMUNITY)
Admission: EM | Admit: 2019-07-10 | Discharge: 2019-07-10 | Disposition: A | Discharge status: Home / Self Care | Payer: Self-pay | Attending: Family Medicine | Admitting: Family Medicine

## 2019-07-10 DIAGNOSIS — N50819 Testicular pain, unspecified: Secondary | ICD-10-CM

## 2019-07-10 MED ORDER — LEVOFLOXACIN 500 MG PO TABS: 500.0000 mg | ORAL_TABLET | Freq: Every day | ORAL | 0 refills | Status: AC

## 2019-07-10 NOTE — ED Triage Notes (Signed)
Pt states he saw blood in his semen last night after sex. Pt states he has pain in his right testicle.

## 2019-07-13 NOTE — ED Provider Notes (Signed)
[place]   [id number] 07/10/19 Arrival Time: 5643  ASSESSMENT & PLAN:  1. Testicular discomfort     Recommend: Follow-up Information    [facility].   Why: If worsening or failing to improve as anticipated. Contact information: [place] [phone]          Will treat for possible epididymitis.  Begin: Meds ordered this encounter  Medications  . levofloxacin (LEVAQUIN) 500 MG tablet    Sig: Take 1 tablet (500 mg total) by mouth daily.    Dispense:  7 tablet    Refill:  0    He is not concerned over possibility of STI. Declines testing. No signs of testicular torsion; discussed.   Reviewed expectations re: course of current medical issues. Questions answered. Outlined signs and symptoms indicating need for more acute intervention. Patient verbalized understanding. After Visit Summary given.   SUBJECTIVE:  Steven Burch is a 24 y.o. male who presents with complaint of noticing a small amount of bllood in his semen a few d ago. None since. No urinary symptoms or gross hematuria. Has noticed mild R testicular soreness over the past week. No injury. No swelling. No home tx. History of STI: none reported.   OBJECTIVE:  Vitals:   07/10/19 1406 07/10/19 1407  BP:  114/66  Pulse:  70  Resp:  16  Temp:  98.4 F (36.9 C)  TempSrc:  Oral  SpO2:  100%  Weight: 70.3 kg      General appearance: alert, cooperative, appears stated age and no distress Throat: lips, mucosa, and tongue normal; teeth and gums normal CV: RRR Lungs: CTAB Back: no CVA tenderness; FROM at waist Abdomen: soft, non-tender GU: normal appearing genitalia; is slightly tender over R epididymis Skin: warm and dry Psychological: alert and cooperative; normal mood and affect.    Labs Reviewed - No data to display  Allergies  Allergen Reactions  . Nsaids Other (See Comments)    Patient has STOMACH ISSUES and  cannot tolerate NSAIDS (of any kind)    Past Medical History:  Diagnosis Date  . Multiple gastric ulcers    Family History  Problem Relation Age of Onset  . Healthy Mother   . Healthy Father   . Diabetes Maternal Grandmother   . Diabetes Maternal Grandfather   . Heart attack Maternal Grandfather        mild  . Colon cancer Neg Hx   . Esophageal cancer Neg Hx   . Rectal cancer Neg Hx   . Stomach cancer Neg Hx    Social History   Socioeconomic History  . Marital status: Single    Spouse name: Not on file  . Number of children: 0  . Years of education: Not on file  . Highest education level: Not on file  Occupational History  . Occupation: Chemical engineer  Tobacco Use  . Smoking status: Never Smoker  . Smokeless tobacco: Never Used  Substance and Sexual Activity  . Alcohol use: No  . Drug use: Yes    Types: Marijuana    Comment: "cut back a lot"  . Sexual activity: Yes  Other Topics Concern  . Not on file  Social History Narrative  . Not on file   Social Determinants of Health   Financial Resource Strain:   . Difficulty of Paying Living Expenses: Not on file  Food Insecurity:   . Worried About Charity fundraiser in the Last  Year: Not on file  . Ran Out of Food in the Last Year: Not on file  Transportation Needs:   . Lack of Transportation (Medical): Not on file  . Lack of Transportation (Non-Medical): Not on file  Physical Activity:   . Days of Exercise per Week: Not on file  . Minutes of Exercise per Session: Not on file  Stress:   . Feeling of Stress : Not on file  Social Connections:   . Frequency of Communication with Friends and Family: Not on file  . Frequency of Social Gatherings with Friends and Family: Not on file  . Attends Religious Services: Not on file  . Active Member of Clubs or Organizations: Not on file  . Attends Banker Meetings: Not on file  . Marital Status: Not on file  Intimate Partner Violence:   . Fear of Current  or Ex-Partner: Not on file  . Emotionally Abused: Not on file  . Physically Abused: Not on file  . Sexually Abused: Not on file          Mardella Layman, MD 07/13/19 [phone]

## 2019-10-17 ENCOUNTER — Other Ambulatory Visit: Payer: Self-pay

## 2019-10-17 ENCOUNTER — Encounter (HOSPITAL_COMMUNITY): Payer: Self-pay | Admitting: Emergency Medicine

## 2019-10-17 ENCOUNTER — Emergency Department (HOSPITAL_COMMUNITY)
Admission: EM | Admit: 2019-10-17 | Discharge: 2019-10-17 | Disposition: A | Discharge status: Home / Self Care | Payer: Medicaid Other | Attending: Emergency Medicine | Admitting: Emergency Medicine

## 2019-10-17 DIAGNOSIS — F1290 Cannabis use, unspecified, uncomplicated: Secondary | ICD-10-CM | POA: Insufficient documentation

## 2019-10-17 DIAGNOSIS — R112 Nausea with vomiting, unspecified: Secondary | ICD-10-CM

## 2019-10-17 DIAGNOSIS — R197 Diarrhea, unspecified: Secondary | ICD-10-CM | POA: Insufficient documentation

## 2019-10-17 DIAGNOSIS — Z79899 Other long term (current) drug therapy: Secondary | ICD-10-CM | POA: Insufficient documentation

## 2019-10-17 LAB — CBC
HCT: 46.7 % (ref 39.0–52.0)
Hemoglobin: 15.5 g/dL (ref 13.0–17.0)
MCH: 28.3 pg (ref 26.0–34.0)
MCHC: 33.2 g/dL (ref 30.0–36.0)
MCV: 85.4 fL (ref 80.0–100.0)
Platelets: 248 10*3/uL (ref 150–400)
RBC: 5.47 MIL/uL (ref 4.22–5.81)
RDW: 12.9 % (ref 11.5–15.5)
WBC: 11.8 10*3/uL — ABNORMAL HIGH (ref 4.0–10.5)
nRBC: 0 % (ref 0.0–0.2)

## 2019-10-17 LAB — COMPREHENSIVE METABOLIC PANEL
ALT: 16 U/L (ref 0–44)
AST: 21 U/L (ref 15–41)
Albumin: 5 g/dL (ref 3.5–5.0)
Alkaline Phosphatase: 75 U/L (ref 38–126)
Anion gap: 12 (ref 5–15)
BUN: 12 mg/dL (ref 6–20)
CO2: 25 mmol/L (ref 22–32)
Calcium: 10 mg/dL (ref 8.9–10.3)
Chloride: 104 mmol/L (ref 98–111)
Creatinine, Ser: 1.04 mg/dL (ref 0.61–1.24)
GFR calc Af Amer: >60 mL/min (ref >60)
GFR calc non Af Amer: >60 mL/min (ref >60)
Glucose, Bld: 123 mg/dL — ABNORMAL HIGH (ref 70–99)
Potassium: 3.9 mmol/L (ref 3.5–5.1)
Sodium: 141 mmol/L (ref 135–145)
Total Bilirubin: 1.5 mg/dL — ABNORMAL HIGH (ref 0.3–1.2)
Total Protein: 8 g/dL (ref 6.5–8.1)

## 2019-10-17 LAB — URINALYSIS, ROUTINE W REFLEX MICROSCOPIC
Bilirubin Urine: NEGATIVE
Glucose, UA: NEGATIVE mg/dL
Hgb urine dipstick: NEGATIVE
Ketones, ur: 80 mg/dL — AB
Leukocytes,Ua: NEGATIVE
Nitrite: NEGATIVE
Protein, ur: 100 mg/dL — AB
Specific Gravity, Urine: 1.03 (ref 1.005–1.030)
pH: 6 (ref 5.0–8.0)

## 2019-10-17 LAB — RAPID URINE DRUG SCREEN, HOSP PERFORMED
Amphetamines: NOT DETECTED
Barbiturates: NOT DETECTED
Benzodiazepines: NOT DETECTED
Cocaine: NOT DETECTED
Opiates: NOT DETECTED
Tetrahydrocannabinol: POSITIVE — AB

## 2019-10-17 LAB — LIPASE, BLOOD: Lipase: 21 U/L (ref 11–51)

## 2019-10-17 MED ORDER — OMEPRAZOLE 20 MG PO CPDR: 20.0000 mg | DELAYED_RELEASE_CAPSULE | Freq: Every day | ORAL | 0 refills | Status: AC

## 2019-10-17 MED ORDER — SODIUM CHLORIDE 0.9 % IV BOLUS
1000.0000 mL | Freq: Once | INTRAVENOUS | Status: AC
  Administered 2019-10-17: 18:00:00 1000 mL via INTRAVENOUS

## 2019-10-17 MED ORDER — SODIUM CHLORIDE 0.9% FLUSH
3.0000 mL | Freq: Once | INTRAVENOUS | Status: AC
  Administered 2019-10-17: 3 mL via INTRAVENOUS

## 2019-10-17 MED ORDER — PROMETHAZINE HCL 25 MG/ML IJ SOLN
25.0000 mg | Freq: Once | INTRAMUSCULAR | Status: AC
  Administered 2019-10-17: 18:00:00 25 mg via INTRAVENOUS
  Filled 2019-10-17: qty 1

## 2019-10-17 MED ORDER — ONDANSETRON HCL 4 MG PO TABS: 4.0000 mg | ORAL_TABLET | Freq: Four times a day (QID) | ORAL | 0 refills | Status: AC

## 2019-10-17 MED ORDER — ONDANSETRON HCL 4 MG/2ML IJ SOLN
4.0000 mg | Freq: Once | INTRAMUSCULAR | Status: AC
  Administered 2019-10-17: 20:00:00 4 mg via INTRAVENOUS
  Filled 2019-10-17: qty 2

## 2019-10-17 MED ORDER — PANTOPRAZOLE SODIUM 40 MG IV SOLR
40.0000 mg | Freq: Once | INTRAVENOUS | Status: AC
  Administered 2019-10-17: 40 mg via INTRAVENOUS
  Filled 2019-10-17: qty 40

## 2019-10-17 NOTE — ED Triage Notes (Signed)
Pt states he has stomach ulcers and is having a flare up. Pt has been vomiting for 2 days. States he hasn't been on any medications for his ulcers for over a year, but has been ok until recently.

## 2019-10-17 NOTE — ED Provider Notes (Signed)
[facility] EMERGENCY DEPARTMENT Provider Note   CSN: [id number] Arrival date & time: 10/17/19  1411     History Chief Complaint  Patient presents with  . Emesis    Steven Burch is a 24 y.o. male.  24 y.o male with a PMH of Multiple gastric ulcers presents to the ED with chief complaint of nausea, vomiting x2 days.  Patient reports a prior history of peptic ulcers, reports he was previously placed on Protonix along with promethazine, has not taken this medication in several months due to lack of symptoms.  Reports he had a "cheat day ", states he had pizza and ever since his symptoms have began.  Ports multiple episodes of nonbloody, nonbilious emesis.  Has been taking Powerade without improvement in his symptoms.  Also reports 2 episodes of diarrhea, no blood in his stool.  Also endorses abdominal pain along the epigastric region, nonradiating.  No prior surgical history to his abdomen.  No fevers, chest pain, shortness of breath, recent travel.  The history is provided by the patient and medical records.       Past Medical History:  Diagnosis Date  . Multiple gastric ulcers     Patient Active Problem List   Diagnosis Date Noted  . Healthcare maintenance 02/26/2018  . Nausea and vomiting 02/26/2018  . Gastroesophageal reflux disease 02/26/2018    Past Surgical History:  Procedure Laterality Date  . WRIST SURGERY Right    as a child       Family History  Problem Relation Age of Onset  . Healthy Mother   . Healthy Father   . Diabetes Maternal Grandmother   . Diabetes Maternal Grandfather   . Heart attack Maternal Grandfather        mild  . Colon cancer Neg Hx   . Esophageal cancer Neg Hx   . Rectal cancer Neg Hx   . Stomach cancer Neg Hx     Social History   Tobacco Use  . Smoking status: Never Smoker  . Smokeless tobacco: Never Used  Substance Use Topics  . Alcohol use: No  . Drug use: Yes    Types: Marijuana    Comment: "cut back  a lot"    Home Medications Prior to Admission medications   Medication Sig Start Date End Date Taking? Authorizing Provider  azithromycin (ZITHROMAX) 250 MG tablet Take 1 tablet (250 mg total) by mouth daily. Take first 2 tablets together, then 1 every day until finished. Patient not taking: Reported on 12/26/2018 11/13/18   Hall-Potvin, [place], PA-C  cetirizine (ZYRTEC) 10 MG tablet Take 1 tablet (10 mg total) by mouth daily. 11/13/18   Hall-Potvin, [place], PA-C  levofloxacin (LEVAQUIN) 500 MG tablet Take 1 tablet (500 mg total) by mouth daily. 07/10/19   Mardella Layman, MD  omeprazole (PRILOSEC) 20 MG capsule Take 1 capsule (20 mg total) by mouth daily for 5 days. 10/17/19 10/22/19  Claude Manges, PA-C  ondansetron (ZOFRAN) 4 MG tablet Take 1 tablet (4 mg total) by mouth every 6 (six) hours for 5 days. 10/17/19 10/22/19  Claude Manges, PA-C  promethazine (PHENERGAN) 25 MG tablet Take 1 tablet (25 mg total) by mouth every 8 (eight) hours as needed for nausea or vomiting. 01/11/19   Michela Pitcher A, PA-C    Allergies    Nsaids  Review of Systems   Review of Systems  Constitutional: Negative for fever.  HENT: Negative for sore throat.   Eyes: Negative for photophobia.  Respiratory:  Negative for shortness of breath.   Cardiovascular: Negative for chest pain.  Gastrointestinal: Positive for abdominal pain, diarrhea, nausea and vomiting.  Genitourinary: Negative for flank pain.  Musculoskeletal: Negative for back pain.  Skin: Negative for pallor and wound.  Neurological: Negative for light-headedness and headaches.  All other systems reviewed and are negative.   Physical Exam Updated Vital Signs BP 127/79 (BP Location: Right Arm)   Pulse (!) 52   Temp 97.9 F (36.6 C) (Oral)   Resp 17   SpO2 99%   Physical Exam Vitals and nursing note reviewed.  Constitutional:      Appearance: Normal appearance.  HENT:     Head: Normocephalic and atraumatic.     Comments: Oropharynx appears dry.     Nose: Nose normal.     Mouth/Throat:     Mouth: Mucous membranes are dry.  Cardiovascular:     Rate and Rhythm: Normal rate.  Pulmonary:     Effort: Pulmonary effort is normal.     Breath sounds: No wheezing.     Comments: Lungs without any wheezing, rhonchi, rales. Abdominal:     General: Abdomen is flat.     Palpations: Abdomen is soft.     Tenderness: There is abdominal tenderness. There is no right CVA tenderness or left CVA tenderness.     Comments: Bowel sounds are decreased, tenderness to palpation along the epigastric region.  No guarding or rebound.  Musculoskeletal:     Cervical back: Normal range of motion and neck supple.  Skin:    General: Skin is warm and dry.  Neurological:     Mental Status: He is alert and oriented to person, place, and time.     ED Results / Procedures / Treatments   Labs (all labs ordered are listed, but only abnormal results are displayed) Labs Reviewed  COMPREHENSIVE METABOLIC PANEL - Abnormal; Notable for the following components:      Result Value   Glucose, Bld 123 (*)    Total Bilirubin 1.5 (*)    All other components within normal limits  CBC - Abnormal; Notable for the following components:   WBC 11.8 (*)    All other components within normal limits  URINALYSIS, ROUTINE W REFLEX MICROSCOPIC - Abnormal; Notable for the following components:   Ketones, ur 80 (*)    Protein, ur 100 (*)    Bacteria, UA RARE (*)    All other components within normal limits  RAPID URINE DRUG SCREEN, HOSP PERFORMED - Abnormal; Notable for the following components:   Tetrahydrocannabinol POSITIVE (*)    All other components within normal limits  LIPASE, BLOOD    EKG None  Radiology No results found.  Procedures Procedures (including critical care time)  Medications Ordered in ED Medications  sodium chloride flush (NS) 0.9 % injection 3 mL (3 mLs Intravenous Given 10/17/19 1933)  sodium chloride 0.9 % bolus 1,000 mL (0 mLs Intravenous Stopped  10/17/19 1923)  pantoprazole (PROTONIX) injection 40 mg (40 mg Intravenous Given 10/17/19 1733)  promethazine (PHENERGAN) injection 25 mg (25 mg Intravenous Given 10/17/19 1733)  ondansetron (ZOFRAN) injection 4 mg (4 mg Intravenous Given 10/17/19 1933)    ED Course  I have reviewed the triage vital signs and the nursing notes.  Pertinent labs & imaging results that were available during my care of the patient were reviewed by me and considered in my medical decision making (see chart for details).    MDM Rules/Calculators/A&P   With past medical history  of multiple peptic ulcers presents to the ED with complaints of nausea and vomiting x2 days.  Does report nonbilious, nonbloody emesis, some mild diarrhea.  Reports that he was previously placed on promethazine along with Protonix for symptomatic control of his ulcers, however has discontinued this medication as he has been asymptomatic in the past year.  Does report he had "a cheat day ".  NO Urinary symptoms, no penile discharge,  Evaluation patient appears pale, oropharynx is dry, actively vomiting in hallway bed, heart rate is in the 50s, afebrile.  Abdomen is tender to palpation along the epigastric region, no rebound or guarding.  Lungs are clear to auscultation.  Given medication for symptomatic control, labs are pending.  Interpretation of his labs by me, with a mild CBC at 11.8, CMP without any electrolyte derangement despite vomiting episodes.  Kidney function is unremarkable, LFTs are within normal limits.  Lipase level is normal. Extensive review of his prior visits here for the same complaint, has received symptomatic control of these episodes are recurrent.  8:29 PM patient reassessed, does report mild nausea is persistent.  P.o. challenge has been trialed.   8:29 PM patient's p.o. challenge has been completed, no further vomiting episodes.  He is agreeable of discharge home, suspect likely recurrence of his gastritis.  Patient  discharged in stable condition.  Will go home on a short prescription of Zofran along with omeprazole to help with his reflux.  Portions of this note were generated with [employer]. Dictation errors may occur despite best attempts at proofreading.  Final Clinical Impression(s) / ED Diagnoses Final diagnoses:  Intractable vomiting with nausea, unspecified vomiting type    Rx / DC Orders ED Discharge Orders         Ordered    ondansetron (ZOFRAN) 4 MG tablet  Every 6 hours     10/17/19 2028    omeprazole (PRILOSEC) 20 MG capsule  Daily     10/17/19 2028           Janeece Fitting, PA-C 10/17/19 2029    Lennice Sites, DO 10/17/19 2226

## 2019-10-17 NOTE — Discharge Instructions (Addendum)
Your laboratory were within normal limits, you may continue to hydrate with plenty of water.  Please avoid any spicy, acidic food.  I have written a short prescription for Zofran which should help with your nausea along with omeprazole to help with your abdominal discomfort.  Follow-up with your primary care physician as needed.

## 2019-10-17 NOTE — ED Notes (Signed)
PT says he is not ready to urinate "yet."

## 2020-06-19 IMAGING — DX CHEST - 2 VIEW
2 series · 2 of 2 positions shown · non-contrast
Comparison: 03/13/2016

CLINICAL DATA: Productive cough for 2 weeks.

EXAM:
CHEST - 2 VIEW

[chest pa]
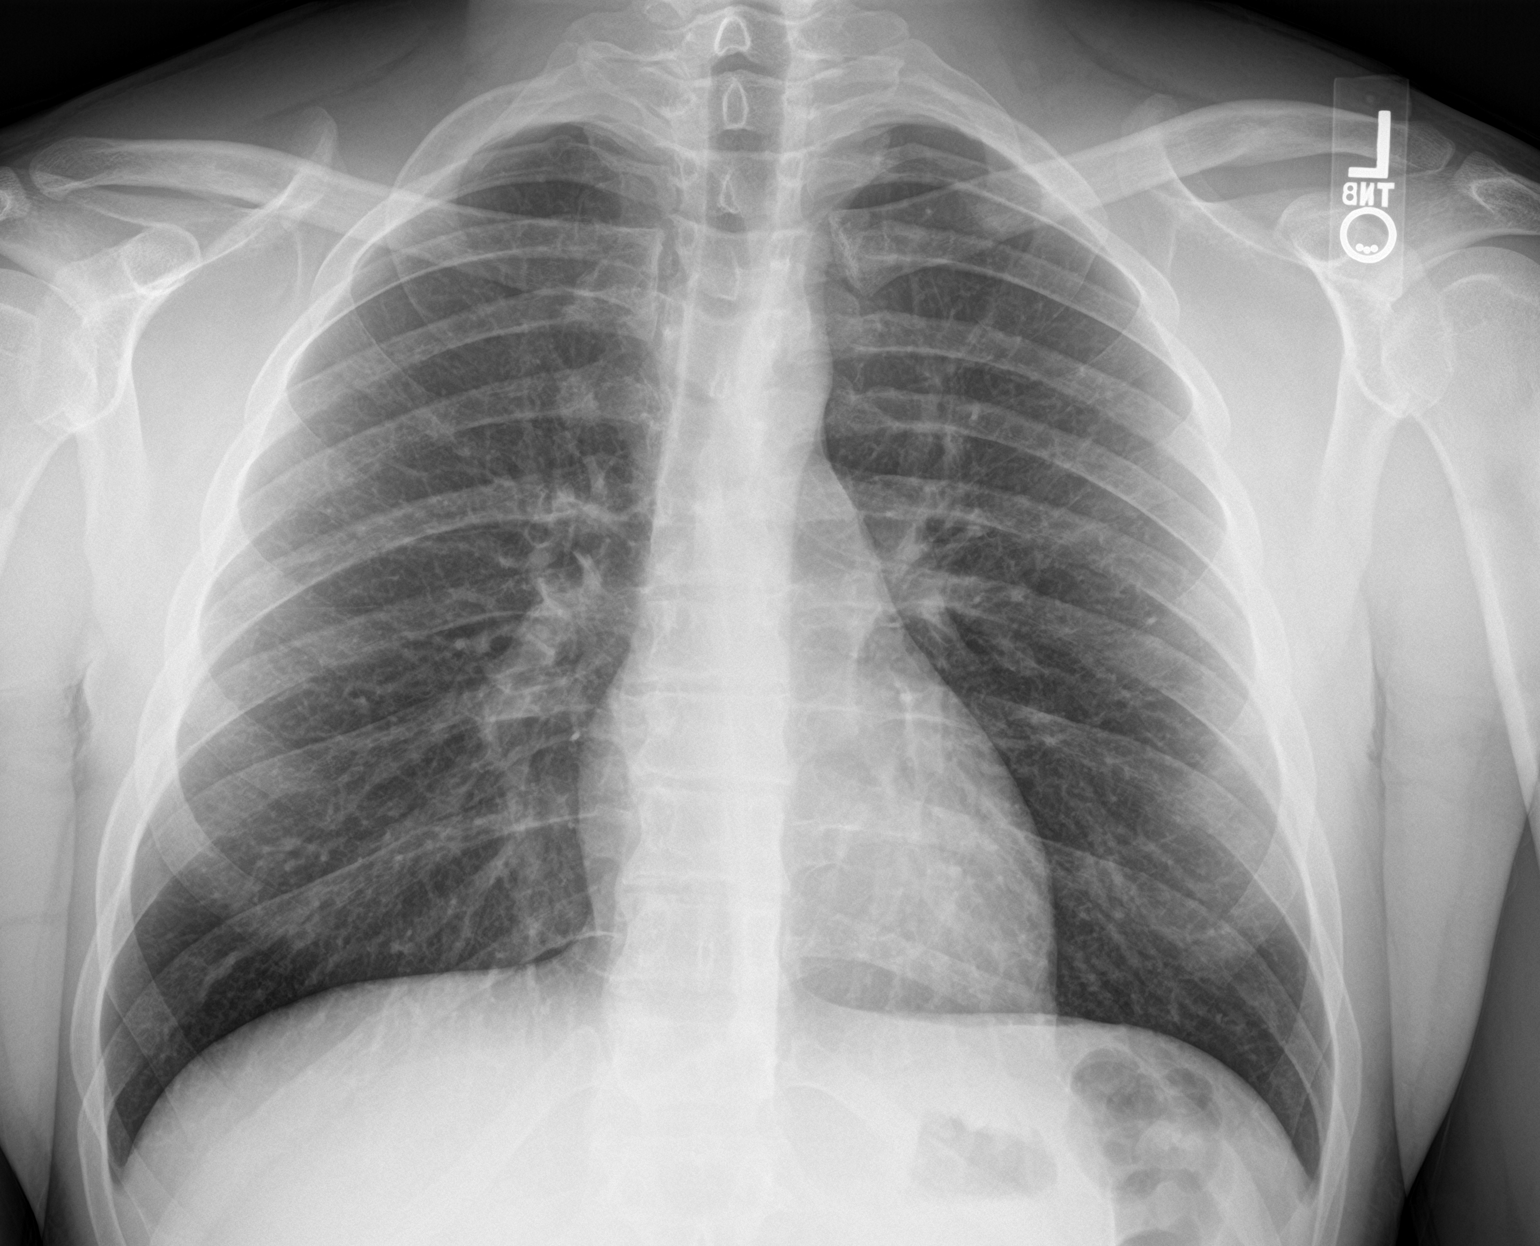

[chest lat]
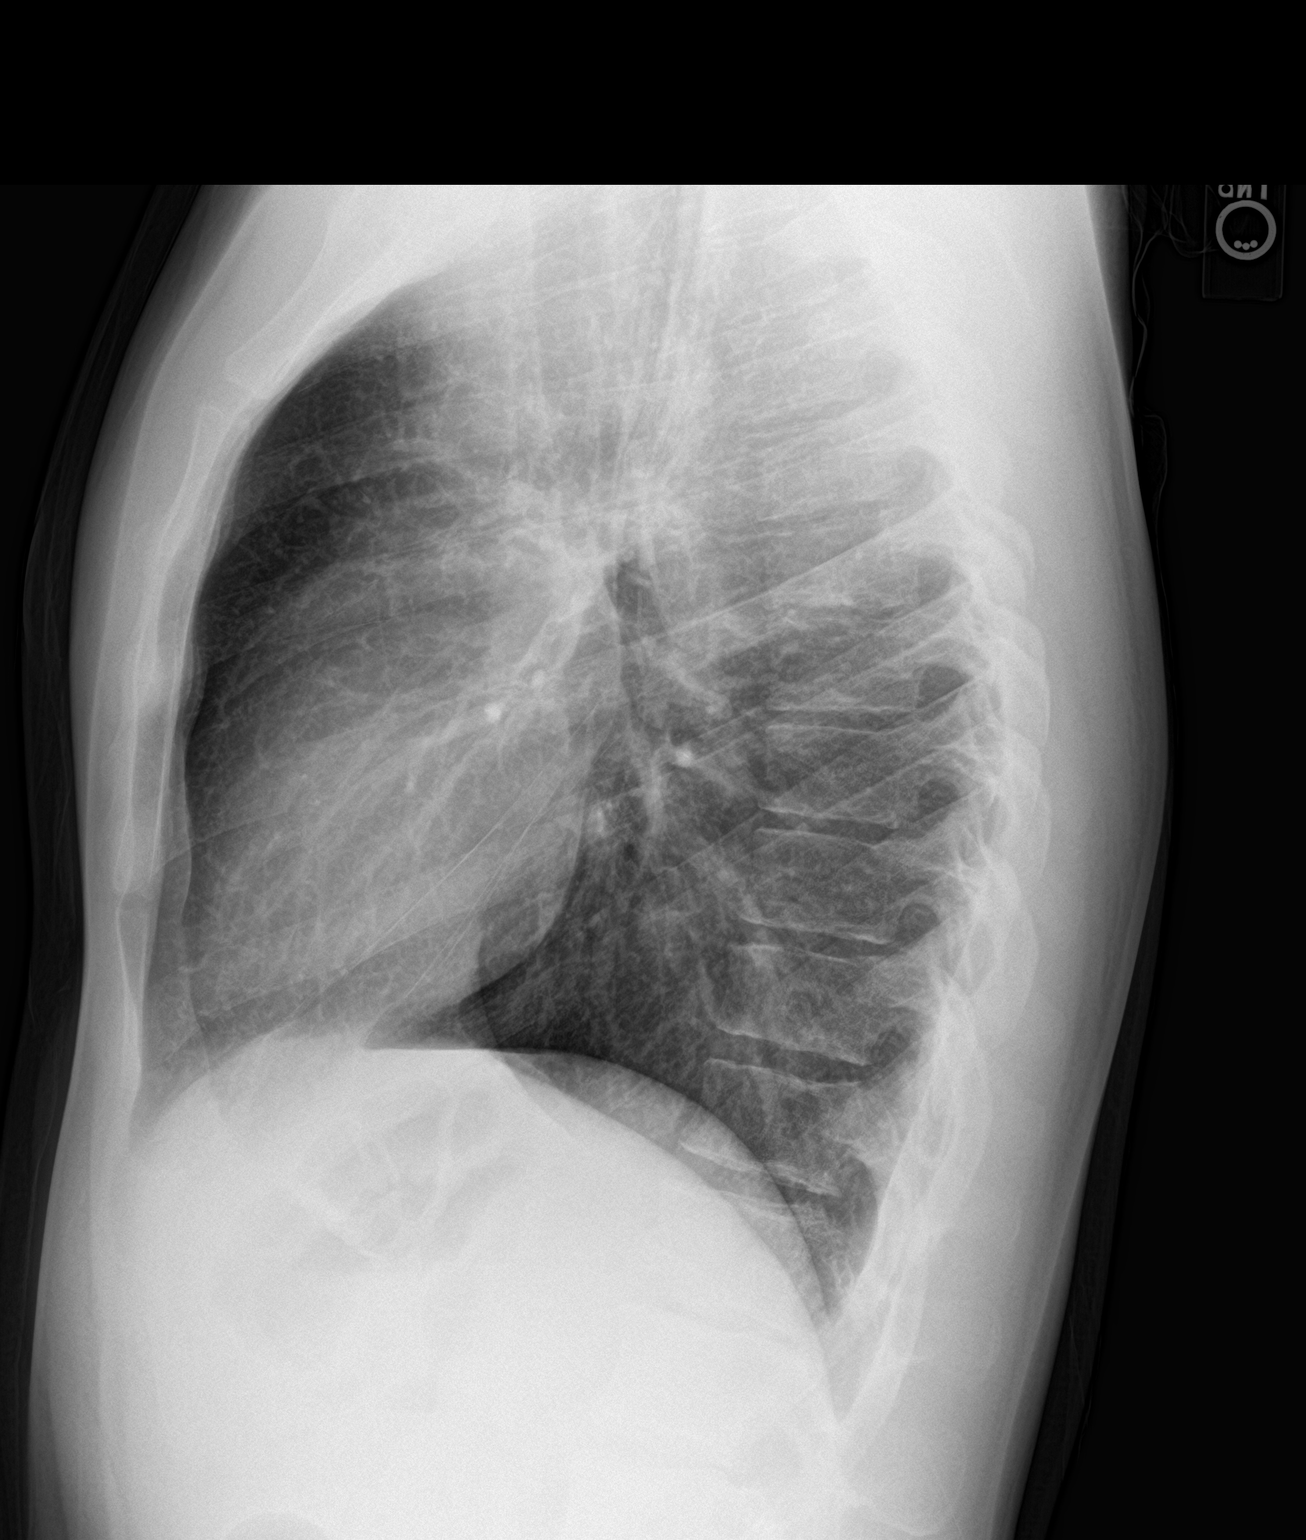

[2 of 2 positions shown; findings below may reference images not displayed]

FINDINGS: The heart size and mediastinal contours are within normal limits.
Both lungs are clear. The visualized skeletal structures are
unremarkable.
IMPRESSION: Negative.  No active cardiopulmonary disease.

## 2020-10-20 ENCOUNTER — Other Ambulatory Visit: Payer: Self-pay

## 2020-10-20 ENCOUNTER — Ambulatory Visit (HOSPITAL_COMMUNITY)
Admission: EM | Admit: 2020-10-20 | Discharge: 2020-10-20 | Disposition: A | Discharge status: Home / Self Care | Payer: Medicaid Other | Attending: Internal Medicine | Admitting: Internal Medicine

## 2020-10-20 ENCOUNTER — Encounter (HOSPITAL_COMMUNITY): Payer: Self-pay | Admitting: Emergency Medicine

## 2020-10-20 DIAGNOSIS — Z1152 Encounter for screening for COVID-19: Secondary | ICD-10-CM | POA: Insufficient documentation

## 2020-10-20 DIAGNOSIS — B349 Viral infection, unspecified: Secondary | ICD-10-CM | POA: Insufficient documentation

## 2020-10-20 NOTE — ED Triage Notes (Signed)
Pt presents today with c/o of headache, nasal congestion and body aches x 2 days. Requests Covid test.

## 2020-10-20 NOTE — ED Notes (Signed)
Pt called t the phone and hang out the call.

## 2020-10-20 NOTE — Discharge Instructions (Addendum)
You most likely have viral illness.   You can take Tylenol and/or Ibuprofen as needed for fever reduction and pain relief.   For cough: honey 1/2 to 1 teaspoon (you can dilute the honey in water or another fluid).  You can use a humidifier for chest congestion and cough.  If you don't have a humidifier, you can sit in the bathroom with the hot shower running.    For sore throat: try warm salt water gargles, cepacol lozenges, throat spray, warm tea or water with lemon/honey, popsicles or ice, or OTC cold relief medicine for throat discomfort.    For congestion: take a daily anti-histamine like Zyrtec, Claritin, and a oral decongestant to help with post nasal drip that may be irritating your throat.    It is important to stay hydrated: drink plenty of fluids (water, gatorade/powerade/pedialyte, juices, or teas) to keep your throat moisturized and help further relieve irritation/discomfort.   Return or go to the Emergency Department if symptoms worsen or do not improve in the next few days.   

## 2020-10-20 NOTE — ED Provider Notes (Signed)
[facility]    CSN: [id number] Arrival date & time: 10/20/20  1858      History   Chief Complaint Chief Complaint  Patient presents with  . Headache  . Nasal Congestion    HPI Steven Burch is a 25 y.o. male.   Patient here for evaluation of headache, nasal congestion, and body aches that have been ongoing since Friday.  Reports girlfriend and her family have recently tested positive for COVID.  Patient requesting COVID test today.  Has not taken any OTC medications or treatments.  Denies any trauma, injury, or other precipitating event.  Denies any specific alleviating or aggravating factors.  Denies any fevers, chest pain, shortness of breath, N/V/D, numbness, tingling, weakness, abdominal pain, or headaches.   ROS: As per HPI, all other pertinent ROS negative   The history is provided by the patient.  Headache Associated symptoms: congestion     Past Medical History:  Diagnosis Date  . Multiple gastric ulcers     Patient Active Problem List   Diagnosis Date Noted  . Healthcare maintenance 02/26/2018  . Nausea and vomiting 02/26/2018  . Gastroesophageal reflux disease 02/26/2018    Past Surgical History:  Procedure Laterality Date  . WRIST SURGERY Right    as a child       Home Medications    Prior to Admission medications   Medication Sig Start Date End Date Taking? Authorizing Provider  azithromycin (ZITHROMAX) 250 MG tablet Take 1 tablet (250 mg total) by mouth daily. Take first 2 tablets together, then 1 every day until finished. Patient not taking: No sig reported 11/13/18   Hall-Potvin, [place], PA-C  cetirizine (ZYRTEC) 10 MG tablet Take 1 tablet (10 mg total) by mouth daily. 11/13/18   Hall-Potvin, [place], PA-C  levofloxacin (LEVAQUIN) 500 MG tablet Take 1 tablet (500 mg total) by mouth daily. 07/10/19   Mardella Layman, MD  omeprazole (PRILOSEC) 20 MG capsule Take 1 capsule (20 mg total) by mouth daily for 5 days. 10/17/19 10/22/19  Claude Manges, PA-C  promethazine (PHENERGAN) 25 MG tablet Take 1 tablet (25 mg total) by mouth every 8 (eight) hours as needed for nausea or vomiting. 01/11/19   Jeanie Sewer, PA-C    Family History Family History  Problem Relation Age of Onset  . Healthy Mother   . Healthy Father   . Diabetes Maternal Grandmother   . Diabetes Maternal Grandfather   . Heart attack Maternal Grandfather        mild  . Colon cancer Neg Hx   . Esophageal cancer Neg Hx   . Rectal cancer Neg Hx   . Stomach cancer Neg Hx     Social History Social History   Tobacco Use  . Smoking status: Never Smoker  . Smokeless tobacco: Never Used  Vaping Use  . Vaping Use: Never used  Substance Use Topics  . Alcohol use: No  . Drug use: Yes    Types: Marijuana    Comment: "cut back a lot"     Allergies   Nsaids   Review of Systems Review of Systems  HENT: Positive for congestion.   Neurological: Positive for headaches.  All other systems reviewed and are negative.    Physical Exam Triage Vital Signs ED Triage Vitals  Enc Vitals Group     BP 10/20/20 2019 (!) 105/58     Pulse Rate 10/20/20 2019 85     Resp 10/20/20 2019 18     Temp  10/20/20 2019 98.5 F (36.9 C)     Temp Source 10/20/20 2019 Oral     SpO2 10/20/20 2019 97 %     Weight 10/20/20 2017 158 lb (71.7 kg)     Height --      Head Circumference --      Peak Flow --      Pain Score 10/20/20 2017 4     Pain Loc --      Pain Edu? --      Excl. in GC? --    No data found.  Updated Vital Signs BP (!) 105/58 (BP Location: Right Arm)   Pulse 85   Temp 98.5 F (36.9 C) (Oral)   Resp 18   Wt 158 lb (71.7 kg)   SpO2 97%   BMI 24.02 kg/m   Visual Acuity Right Eye Distance:   Left Eye Distance:   Bilateral Distance:    Right Eye Near:   Left Eye Near:    Bilateral Near:     Physical Exam Vitals and nursing note reviewed.  Constitutional:      General: He is not in acute distress.    Appearance: Normal appearance. He is not  ill-appearing, toxic-appearing or diaphoretic.  HENT:     Head: Normocephalic and atraumatic.  Eyes:     Conjunctiva/sclera: Conjunctivae normal.     Pupils: Pupils are equal, round, and reactive to light.  Cardiovascular:     Rate and Rhythm: Normal rate.     Pulses: Normal pulses.  Pulmonary:     Effort: Pulmonary effort is normal.     Breath sounds: Normal breath sounds.  Abdominal:     General: Abdomen is flat. Bowel sounds are normal.  Musculoskeletal:        General: Normal range of motion.     Cervical back: Normal range of motion.  Skin:    General: Skin is warm and dry.  Neurological:     General: No focal deficit present.     Mental Status: He is alert and oriented to person, place, and time.     GCS: GCS eye subscore is 4. GCS verbal subscore is 5. GCS motor subscore is 6.     Sensory: No sensory deficit.     Motor: No weakness.     Coordination: Romberg sign negative. Coordination normal.     Gait: Gait normal.  Psychiatric:        Mood and Affect: Mood normal.      UC Treatments / Results  Labs (all labs ordered are listed, but only abnormal results are displayed) Labs Reviewed  SARS CORONAVIRUS 2 (TAT 6-24 HRS)    EKG   Radiology No results found.  Procedures Procedures (including critical care time)  Medications Ordered in UC Medications - No data to display  Initial Impression / Assessment and Plan / UC Course  I have reviewed the triage vital signs and the nursing notes.  Pertinent labs & imaging results that were available during my care of the patient were reviewed by me and considered in my medical decision making (see chart for details).    Assessment negative for red flags or concerns.  COVID test pending and work note supplied to patient.  Discussed conservative symptom management as described below in discharge instructions.  Patient may follow-up as needed. Final Clinical Impressions(s) / UC Diagnoses   Final diagnoses:  Encounter  for screening for COVID-19  Viral illness     Discharge Instructions  You most likely have viral illness.   You can take Tylenol and/or Ibuprofen as needed for fever reduction and pain relief.   For cough: honey 1/2 to 1 teaspoon (you can dilute the honey in water or another fluid).  You can use a humidifier for chest congestion and cough.  If you don't have a humidifier, you can sit in the bathroom with the hot shower running.    For sore throat: try warm salt water gargles, cepacol lozenges, throat spray, warm tea or water with lemon/honey, popsicles or ice, or OTC cold relief medicine for throat discomfort.    For congestion: take a daily anti-histamine like Zyrtec, Claritin, and a oral decongestant to help with post nasal drip that may be irritating your throat.    It is important to stay hydrated: drink plenty of fluids (water, gatorade/powerade/pedialyte, juices, or teas) to keep your throat moisturized and help further relieve irritation/discomfort.   Return or go to the Emergency Department if symptoms worsen or do not improve in the next few days.      ED Prescriptions    None     PDMP not reviewed this encounter.   Ivette Loyal, NP 10/20/20 [phone]

## 2020-10-21 LAB — SARS CORONAVIRUS 2 (TAT 6-24 HRS): SARS Coronavirus 2: NEGATIVE
